# Patient Record
Sex: Male | Born: 1975 | Race: Black or African American | Hispanic: No | Smoking: Current every day smoker
Health system: Southern US, Community
[De-identification: ages and names within clinical notes are randomized; demographics above are authoritative.]

## PROBLEM LIST (undated history)

## (undated) DIAGNOSIS — I10 Essential (primary) hypertension: Secondary | ICD-10-CM

## (undated) HISTORY — PX: HERNIA REPAIR: SHX51

---

## 2001-11-03 ENCOUNTER — Emergency Department (HOSPITAL_COMMUNITY): Admission: EM | Admit: 2001-11-03 | Discharge: 2001-11-03 | Payer: Self-pay | Admitting: Emergency Medicine

## 2002-08-14 ENCOUNTER — Emergency Department (HOSPITAL_COMMUNITY): Admission: EM | Admit: 2002-08-14 | Discharge: 2002-08-14 | Payer: Self-pay | Admitting: *Deleted

## 2002-08-14 ENCOUNTER — Encounter: Payer: Self-pay | Admitting: *Deleted

## 2002-09-12 ENCOUNTER — Encounter: Payer: Self-pay | Admitting: Emergency Medicine

## 2002-09-12 ENCOUNTER — Emergency Department (HOSPITAL_COMMUNITY): Admission: EM | Admit: 2002-09-12 | Discharge: 2002-09-12 | Payer: Self-pay | Admitting: Emergency Medicine

## 2004-09-10 ENCOUNTER — Emergency Department (HOSPITAL_COMMUNITY): Admission: EM | Admit: 2004-09-10 | Discharge: 2004-09-10 | Payer: Self-pay | Admitting: Emergency Medicine

## 2004-10-18 ENCOUNTER — Ambulatory Visit: Payer: Self-pay | Admitting: Orthopedic Surgery

## 2006-10-22 ENCOUNTER — Emergency Department (HOSPITAL_COMMUNITY): Admission: EM | Admit: 2006-10-22 | Discharge: 2006-10-22 | Payer: Self-pay | Admitting: Emergency Medicine

## 2008-06-06 ENCOUNTER — Emergency Department (HOSPITAL_COMMUNITY): Admission: EM | Admit: 2008-06-06 | Discharge: 2008-06-06 | Payer: Self-pay | Admitting: Emergency Medicine

## 2010-01-07 ENCOUNTER — Emergency Department (HOSPITAL_COMMUNITY): Admission: EM | Admit: 2010-01-07 | Discharge: 2010-01-07 | Payer: Self-pay | Admitting: Emergency Medicine

## 2010-03-03 ENCOUNTER — Emergency Department (HOSPITAL_COMMUNITY): Admission: EM | Admit: 2010-03-03 | Discharge: 2010-03-03 | Payer: Self-pay | Admitting: Emergency Medicine

## 2010-10-28 ENCOUNTER — Emergency Department (HOSPITAL_COMMUNITY): Payer: Self-pay

## 2010-10-28 ENCOUNTER — Emergency Department (HOSPITAL_COMMUNITY)
Admission: EM | Admit: 2010-10-28 | Discharge: 2010-10-28 | Disposition: A | Discharge status: Home / Self Care | Payer: Self-pay | Attending: Emergency Medicine | Admitting: Emergency Medicine

## 2010-10-28 DIAGNOSIS — J069 Acute upper respiratory infection, unspecified: Secondary | ICD-10-CM | POA: Insufficient documentation

## 2010-10-28 DIAGNOSIS — R059 Cough, unspecified: Secondary | ICD-10-CM | POA: Insufficient documentation

## 2010-10-28 DIAGNOSIS — J019 Acute sinusitis, unspecified: Secondary | ICD-10-CM | POA: Insufficient documentation

## 2010-10-28 DIAGNOSIS — R0602 Shortness of breath: Secondary | ICD-10-CM | POA: Insufficient documentation

## 2010-10-28 DIAGNOSIS — R05 Cough: Secondary | ICD-10-CM | POA: Insufficient documentation

## 2011-10-08 ENCOUNTER — Emergency Department (HOSPITAL_COMMUNITY): Payer: Self-pay

## 2011-10-08 ENCOUNTER — Emergency Department (HOSPITAL_COMMUNITY)
Admission: EM | Admit: 2011-10-08 | Discharge: 2011-10-08 | Disposition: A | Discharge status: Home / Self Care | Payer: Self-pay | Attending: Emergency Medicine | Admitting: Emergency Medicine

## 2011-10-08 ENCOUNTER — Encounter (HOSPITAL_COMMUNITY): Payer: Self-pay | Admitting: *Deleted

## 2011-10-08 DIAGNOSIS — Z79899 Other long term (current) drug therapy: Secondary | ICD-10-CM | POA: Insufficient documentation

## 2011-10-08 DIAGNOSIS — M7989 Other specified soft tissue disorders: Secondary | ICD-10-CM | POA: Insufficient documentation

## 2011-10-08 DIAGNOSIS — M25476 Effusion, unspecified foot: Secondary | ICD-10-CM | POA: Insufficient documentation

## 2011-10-08 DIAGNOSIS — M109 Gout, unspecified: Secondary | ICD-10-CM | POA: Insufficient documentation

## 2011-10-08 DIAGNOSIS — I10 Essential (primary) hypertension: Secondary | ICD-10-CM | POA: Insufficient documentation

## 2011-10-08 DIAGNOSIS — R609 Edema, unspecified: Secondary | ICD-10-CM | POA: Insufficient documentation

## 2011-10-08 DIAGNOSIS — M25473 Effusion, unspecified ankle: Secondary | ICD-10-CM | POA: Insufficient documentation

## 2011-10-08 DIAGNOSIS — M79609 Pain in unspecified limb: Secondary | ICD-10-CM | POA: Insufficient documentation

## 2011-10-08 HISTORY — DX: Essential (primary) hypertension: I10

## 2011-10-08 MED ORDER — IBUPROFEN 800 MG PO TABS
800.0000 mg | ORAL_TABLET | Freq: Once | ORAL | Status: AC
  Administered 2011-10-08: 800 mg via ORAL
  Filled 2011-10-08: qty 1

## 2011-10-08 MED ORDER — HYDROCODONE-ACETAMINOPHEN 5-325 MG PO TABS: ORAL_TABLET | ORAL | Status: DC

## 2011-10-08 MED ORDER — HYDROCODONE-ACETAMINOPHEN 5-325 MG PO TABS
2.0000 | ORAL_TABLET | Freq: Once | ORAL | Status: AC
  Administered 2011-10-08: 2 via ORAL
  Filled 2011-10-08: qty 2

## 2011-10-08 MED ORDER — IBUPROFEN 800 MG PO TABS: 800.0000 mg | ORAL_TABLET | Freq: Four times a day (QID) | ORAL | Status: AC | PRN

## 2011-10-08 MED ORDER — IBUPROFEN 800 MG PO TABS: 800.0000 mg | ORAL_TABLET | Freq: Once | ORAL | Status: DC

## 2011-10-08 NOTE — ED Notes (Signed)
Left foot swollen for 2 days, denies any injury, able to ambulate some on it

## 2011-10-08 NOTE — ED Notes (Signed)
Left foot warm and pedal pulse is present

## 2011-10-08 NOTE — ED Provider Notes (Signed)
Medical screening examination/treatment/procedure(s) were performed by non-physician practitioner and as supervising physician I was immediately available for consultation/collaboration.  Camani Sesay R. Lachandra Dettmann, MD 10/08/11 2324 

## 2011-10-08 NOTE — ED Provider Notes (Signed)
History     CSN: [id number]  Arrival date & time 10/08/11  1803   First MD Initiated Contact with Patient 10/08/11 1810      Chief Complaint  Patient presents with  . Foot Pain    (Consider location/radiation/quality/duration/timing/severity/associated sxs/prior treatment) HPI Comments: Patient presents with a two-day history of pain and mild swelling in his left great toe. He denies injury but states he is on his feet during his entire shift at work. His job duties are unchanged for the past 8 months. He wears steel toed shoes. Standing increases pain, and his worst towards the end of his shift. He has found no alleviating or score his pain. Of note, he states he has had elevated uric acid levels on general blood tests done during health fairs at his work. He has never been diagnosed with gout, but does have a positive family history for this illness. He has taken ibuprofen with mild improvement in his symptoms.  Patient is a 36 y.o. male presenting with lower extremity pain.  Foot Pain Associated symptoms include joint swelling. Pertinent negatives include no abdominal pain, arthralgias, chest pain, congestion, fever, headaches, nausea, neck pain, numbness, rash, sore throat or weakness.    Past Medical History  Diagnosis Date  . Hypertension     Past Surgical History  Procedure Date  . Hernia repair     History reviewed. No pertinent family history.  History  Substance Use Topics  . Smoking status: Current Everyday Smoker -- 0.5 packs/day    Types: Cigarettes  . Smokeless tobacco: Not on file  . Alcohol Use: Yes     once aweek      Review of Systems  Constitutional: Negative for fever.  HENT: Negative for congestion, sore throat and neck pain.   Eyes: Negative.   Respiratory: Negative for chest tightness and shortness of breath.   Cardiovascular: Negative for chest pain.  Gastrointestinal: Negative for nausea and abdominal pain.  Genitourinary: Negative.     Musculoskeletal: Positive for joint swelling. Negative for arthralgias.  Skin: Negative.  Negative for rash and wound.  Neurological: Negative for dizziness, weakness, light-headedness, numbness and headaches.  Hematological: Negative.   Psychiatric/Behavioral: Negative.     Allergies  Penicillins  Home Medications   Current Outpatient Rx  Name Route Sig Dispense Refill  . IBUPROFEN 200 MG PO TABS Oral Take 800 mg by mouth as needed. FOR PAIN    . LISINOPRIL-HYDROCHLOROTHIAZIDE 20-25 MG PO TABS Oral Take 1 tablet by mouth daily.    Marland Kitchen LOVASTATIN 20 MG PO TABS Oral Take 20 mg by mouth at bedtime.    Marland Kitchen HYDROCODONE-ACETAMINOPHEN 5-325 MG PO TABS  Take one or 2 tablets by mouth every 4 hours when necessary pain 20 tablet 0  . IBUPROFEN 800 MG PO TABS Oral Take 1 tablet (800 mg total) by mouth once. 21 tablet 0    BP 172/94  Pulse 108  Temp(Src) 99 F (37.2 C) (Oral)  Resp 16  Ht 6\' 2"  (1.88 m)  Wt 289 lb (131.09 kg)  BMI 37.11 kg/m2  SpO2 100%  Physical Exam  Nursing note and vitals reviewed. Constitutional: He is oriented to person, place, and time. He appears well-developed and well-nourished.  HENT:  Head: Normocephalic and atraumatic.  Eyes: Conjunctivae are normal.  Neck: Normal range of motion.  Cardiovascular: Normal rate, regular rhythm, normal heart sounds and intact distal pulses.   Pulmonary/Chest: Effort normal and breath sounds normal. He has no wheezes.  Abdominal: Soft.  Bowel sounds are normal. There is no tenderness.  Musculoskeletal: Normal range of motion.  Neurological: He is alert and oriented to person, place, and time.  Skin: Skin is warm and dry. There is erythema.       Modest edema and erythema appreciated at left MTP joint of the first toe. Equal dorsalis pedis pulses bilateral. No visible deformity or trauma. No skin, nails appear healthy.   Psychiatric: He has a normal mood and affect.    ED Course  Procedures (including critical care  time)  Labs Reviewed - No data to display Dg Toe Great Left  10/08/2011  *RADIOLOGY REPORT*  Clinical Data: Pain and swelling for 2 days.  No injury.  No history of diabetes.  LEFT GREAT TOE  Comparison: None.  Findings: No fracture or dislocation, bony erosion or plain film evidence of osteomyelitis involving the left first toe.  IMPRESSION: Negative as noted above.  Original Report Authenticated By: Fuller Canada, M.D.     1. Gout       MDM  Given patient's history of elevated uric acid levels in the past, present to gouty flare with today's presentation. Ibuprofen, hydrocodone, elevation and heat therapy. Referral to Dr. Felecia Shelling to establish primary medical care, otherwise return or for any worsened symptoms.     Candis Musa, PA 10/08/11 1920

## 2011-10-23 ENCOUNTER — Encounter (HOSPITAL_COMMUNITY): Payer: Self-pay

## 2011-10-23 ENCOUNTER — Emergency Department (HOSPITAL_COMMUNITY)
Admission: EM | Admit: 2011-10-23 | Discharge: 2011-10-23 | Disposition: A | Discharge status: Home / Self Care | Payer: Self-pay | Attending: Emergency Medicine | Admitting: Emergency Medicine

## 2011-10-23 DIAGNOSIS — F17200 Nicotine dependence, unspecified, uncomplicated: Secondary | ICD-10-CM | POA: Insufficient documentation

## 2011-10-23 DIAGNOSIS — M109 Gout, unspecified: Secondary | ICD-10-CM | POA: Insufficient documentation

## 2011-10-23 DIAGNOSIS — I10 Essential (primary) hypertension: Secondary | ICD-10-CM | POA: Insufficient documentation

## 2011-10-23 MED ORDER — INDOMETHACIN 25 MG PO CAPS
50.0000 mg | ORAL_CAPSULE | Freq: Once | ORAL | Status: AC
  Administered 2011-10-23: 50 mg via ORAL
  Filled 2011-10-23: qty 2

## 2011-10-23 MED ORDER — INDOMETHACIN 50 MG PO CAPS: 50.0000 mg | ORAL_CAPSULE | Freq: Three times a day (TID) | ORAL | Status: AC

## 2011-10-23 NOTE — ED Notes (Signed)
Pt reports 2 weeks ago was diagnosed with gout in left foot.  Says was given ibuprofen and some pain medication but is out.  Says great toe is still swollen.

## 2011-10-23 NOTE — ED Provider Notes (Signed)
Medical screening examination/treatment/procedure(s) were performed by non-physician practitioner and as supervising physician I was immediately available for consultation/collaboration. Devoria Albe, MD, Armando Gang   Ward Givens, MD 10/23/11 [phone]

## 2011-10-23 NOTE — ED Provider Notes (Signed)
History     CSN: [id number]  Arrival date & time 10/23/11  1030   First MD Initiated Contact with Patient 10/23/11 1237      Chief Complaint  Patient presents with  . Foot Pain    (Consider location/radiation/quality/duration/timing/severity/associated sxs/prior treatment) Patient is a 36 y.o. male presenting with lower extremity pain. The history is provided by the patient. No language interpreter was used.  Foot Pain This is a new problem. The problem occurs constantly. The problem has been gradually improving. The symptoms are aggravated by walking and standing. He has tried nothing for the symptoms.    Past Medical History  Diagnosis Date  . Hypertension   . Gout     Past Surgical History  Procedure Date  . Hernia repair     No family history on file.  History  Substance Use Topics  . Smoking status: Current Everyday Smoker -- 0.5 packs/day    Types: Cigarettes  . Smokeless tobacco: Not on file  . Alcohol Use: Yes     once aweek      Review of Systems  Musculoskeletal:       Toe pain  All other systems reviewed and are negative.    Allergies  Penicillins  Home Medications   Current Outpatient Rx  Name Route Sig Dispense Refill  . LISINOPRIL-HYDROCHLOROTHIAZIDE 20-25 MG PO TABS Oral Take 1 tablet by mouth daily.    Marland Kitchen LOVASTATIN 20 MG PO TABS Oral Take 20 mg by mouth at bedtime.      BP 152/79  Pulse 80  Temp(Src) 98.7 F (37.1 C) (Oral)  Resp 20  Ht 6\' 2"  (1.88 m)  Wt 280 lb (127.007 kg)  BMI 35.95 kg/m2  SpO2 97%  Physical Exam  Nursing note and vitals reviewed. Constitutional: He is oriented to person, place, and time. He appears well-developed and well-nourished. No distress.  HENT:  Head: Normocephalic and atraumatic.  Eyes: EOM are normal.  Neck: Normal range of motion.  Cardiovascular: Normal rate, regular rhythm, normal heart sounds and intact distal pulses.   Pulmonary/Chest: Effort normal and breath sounds normal. No  respiratory distress.  Abdominal: Soft. He exhibits no distension. There is no tenderness.  Musculoskeletal: He exhibits tenderness.       Left foot: He exhibits decreased range of motion, tenderness, bony tenderness and swelling. He exhibits normal capillary refill, no crepitus, no deformity and no laceration.       Feet:  Neurological: He is alert and oriented to person, place, and time.  Skin: Skin is warm and dry.  Psychiatric: He has a normal mood and affect. Judgment normal.    ED Course  Procedures (including critical care time)  Labs Reviewed - No data to display No results found.   No diagnosis found.    MDM          Worthy Rancher, PA 10/23/11 1251

## 2012-10-18 ENCOUNTER — Emergency Department (HOSPITAL_COMMUNITY)
Admission: EM | Admit: 2012-10-18 | Discharge: 2012-10-18 | Disposition: A | Discharge status: Home / Self Care | Payer: 59 | Attending: Emergency Medicine | Admitting: Emergency Medicine

## 2012-10-18 ENCOUNTER — Encounter (HOSPITAL_COMMUNITY): Payer: Self-pay | Admitting: Emergency Medicine

## 2012-10-18 DIAGNOSIS — R109 Unspecified abdominal pain: Secondary | ICD-10-CM | POA: Insufficient documentation

## 2012-10-18 DIAGNOSIS — R5381 Other malaise: Secondary | ICD-10-CM | POA: Insufficient documentation

## 2012-10-18 DIAGNOSIS — Z8639 Personal history of other endocrine, nutritional and metabolic disease: Secondary | ICD-10-CM | POA: Insufficient documentation

## 2012-10-18 DIAGNOSIS — I10 Essential (primary) hypertension: Secondary | ICD-10-CM | POA: Insufficient documentation

## 2012-10-18 DIAGNOSIS — E860 Dehydration: Secondary | ICD-10-CM | POA: Insufficient documentation

## 2012-10-18 DIAGNOSIS — R509 Fever, unspecified: Secondary | ICD-10-CM | POA: Insufficient documentation

## 2012-10-18 DIAGNOSIS — Z862 Personal history of diseases of the blood and blood-forming organs and certain disorders involving the immune mechanism: Secondary | ICD-10-CM | POA: Insufficient documentation

## 2012-10-18 DIAGNOSIS — F17200 Nicotine dependence, unspecified, uncomplicated: Secondary | ICD-10-CM | POA: Insufficient documentation

## 2012-10-18 DIAGNOSIS — Z79899 Other long term (current) drug therapy: Secondary | ICD-10-CM | POA: Insufficient documentation

## 2012-10-18 DIAGNOSIS — R112 Nausea with vomiting, unspecified: Secondary | ICD-10-CM | POA: Insufficient documentation

## 2012-10-18 DIAGNOSIS — R197 Diarrhea, unspecified: Secondary | ICD-10-CM | POA: Insufficient documentation

## 2012-10-18 DIAGNOSIS — R5383 Other fatigue: Secondary | ICD-10-CM | POA: Insufficient documentation

## 2012-10-18 LAB — URINALYSIS, ROUTINE W REFLEX MICROSCOPIC
Leukocytes, UA: NEGATIVE
Nitrite: NEGATIVE
Specific Gravity, Urine: >1.03 — ABNORMAL HIGH (ref 1.005–1.030)
Urobilinogen, UA: 0.2 mg/dL (ref 0.0–1.0)

## 2012-10-18 LAB — CBC WITH DIFFERENTIAL/PLATELET
Basophils Absolute: 0.1 10*3/uL (ref 0.0–0.1)
Eosinophils Absolute: 0.1 10*3/uL (ref 0.0–0.7)
Eosinophils Relative: 2 % (ref 0–5)
HCT: 47.6 % (ref 39.0–52.0)
Lymphocytes Relative: 45 % (ref 12–46)
Lymphs Abs: 3 10*3/uL (ref 0.7–4.0)
MCH: 31.7 pg (ref 26.0–34.0)
MCV: 91 fL (ref 78.0–100.0)
Monocytes Absolute: 0.7 10*3/uL (ref 0.1–1.0)
RDW: 13 % (ref 11.5–15.5)
WBC: 6.7 10*3/uL (ref 4.0–10.5)

## 2012-10-18 LAB — COMPREHENSIVE METABOLIC PANEL
CO2: 24 mEq/L (ref 19–32)
Calcium: 9.3 mg/dL (ref 8.4–10.5)
Creatinine, Ser: 0.92 mg/dL (ref 0.50–1.35)
GFR calc Af Amer: >90 mL/min (ref >90)
GFR calc non Af Amer: >90 mL/min (ref >90)
Glucose, Bld: 94 mg/dL (ref 70–99)
Total Protein: 7.9 g/dL (ref 6.0–8.3)

## 2012-10-18 MED ORDER — DIPHENOXYLATE-ATROPINE 2.5-0.025 MG PO TABS
2.0000 | ORAL_TABLET | Freq: Once | ORAL | Status: AC
  Administered 2012-10-18: 2 via ORAL
  Filled 2012-10-18: qty 2

## 2012-10-18 MED ORDER — SODIUM CHLORIDE 0.9 % IV BOLUS (SEPSIS)
1000.0000 mL | Freq: Once | INTRAVENOUS | Status: AC
  Administered 2012-10-18: 1000 mL via INTRAVENOUS

## 2012-10-18 NOTE — ED Notes (Signed)
RN at bedside

## 2012-10-18 NOTE — ED Notes (Signed)
Patient reports that on Thursday he started having nausea, vomiting, fever and abdominal pain followed by diarrhea the next day.  States that the nausea, vomiting, fever and abdominal pain have resolved, however, diarrhea continues.  States he is tolerating food and fluids fine.

## 2012-10-18 NOTE — ED Provider Notes (Signed)
History  This chart was scribed for Randy B. Bernette Mayers, MD by Marlin Canary ED Scribe. The patient was seen in room APA14/APA14. Patient's care was started at 1321.  CSN: [id number]  Arrival date & time 10/18/12  1119   First MD Initiated Contact with Patient 10/18/12 1321      Chief Complaint  Patient presents with  . Fatigue  . Diarrhea   The history is provided by the patient. No language interpreter was used.    HPI Comments: Randy Beltran is a 37 y.o. male who presents to the Emergency Department complaining of persistent, watery diarrhea onset 3-4 days ago. There was associated nausea, vomiting, chills, initially which have resolved, but still having abdominal cramping and fatigue. He says that abdominal cramping improves after a BM. He reports bowel movements every 2 hours. Patient says that he is tolerant of fluids and food at this time. He states that he took anti-diarrhea medicine yesterday morning, but it provided no relief. Patient denies fever, blood in stool, dysuria or frequency. He states that his daughter has been sick. He is allergic to penicillin. He denies any history of diabetes. Patient is a current every day smoker.  Past Medical History  Diagnosis Date  . Hypertension   . Gout     Past Surgical History  Procedure Date  . Hernia repair     Family History  Problem Relation Age of Onset  . Diabetes Other     History  Substance Use Topics  . Smoking status: Current Every Day Smoker -- 0.5 packs/day for 5 years    Types: Cigarettes  . Smokeless tobacco: Never Used  . Alcohol Use: Yes     Comment: once aweek      Review of Systems A complete 10 system review of systems was obtained and all systems are negative except as noted in the HPI and PMH.   Allergies  Penicillins  Home Medications   Current Outpatient Rx  Name  Route  Sig  Dispense  Refill  . LISINOPRIL-HYDROCHLOROTHIAZIDE 20-25 MG PO TABS   Oral   Take 1 tablet by mouth  daily.         Marland Kitchen LOVASTATIN 20 MG PO TABS   Oral   Take 20 mg by mouth at bedtime.           BP 148/80  Pulse 84  Temp 98 F (36.7 C) (Oral)  Resp 20  Ht 6\' 2"  (1.88 m)  Wt 260 lb (117.935 kg)  BMI 33.38 kg/m2  SpO2 97%  Physical Exam  Nursing note and vitals reviewed. Constitutional: He is oriented to person, place, and time. He appears well-developed and well-nourished.  HENT:  Head: Normocephalic and atraumatic.       Dry mouth.  Eyes: EOM are normal. Pupils are equal, round, and reactive to light.  Neck: Normal range of motion. Neck supple.  Cardiovascular: Normal rate, normal heart sounds and intact distal pulses.   Pulmonary/Chest: Effort normal and breath sounds normal.  Abdominal: Bowel sounds are normal. He exhibits no distension. There is no tenderness.  Musculoskeletal: Normal range of motion. He exhibits no edema and no tenderness.  Neurological: He is alert and oriented to person, place, and time. He has normal strength. No cranial nerve deficit or sensory deficit.  Skin: Skin is warm and dry. No rash noted.  Psychiatric: He has a normal mood and affect.    ED Course  Procedures (including critical care time) DIAGNOSTIC STUDIES: Oxygen Saturation is  97% on room air, Adequate by my interpretation.    COORDINATION OF CARE: 1322- Will give IV fluids and lomotil. Will order CBC, comprehensive metabolic panel and urinalysis. Patient informed of current plan for treatment and evaluation and agrees with plan at this time.   Labs Reviewed - No data to display No results found.   No diagnosis found.    MDM  UA is concentrated, but otherwise labs unremarkable. Pt advised to continue with oral hydration. Return for worsening pain, fever or blood in stool.       I personally performed the services described in this documentation, which was scribed in my presence. The recorded information has been reviewed and is accurate.     Randy B. Bernette Mayers,  MD 10/18/12 1524

## 2012-10-18 NOTE — ED Notes (Signed)
Patient c/o diarrhea since Thursday afternoon. Per patient woke that morning with chills, nausea, and, vomiting. Vomiting stopped that night and he started to have the diarrhea. Per patient now feeling weak.

## 2012-10-18 NOTE — ED Notes (Signed)
Patient would like something to drink at this time. 

## 2012-10-18 NOTE — ED Notes (Signed)
Patient ambulatory to restroom  ?

## 2012-11-13 ENCOUNTER — Other Ambulatory Visit (HOSPITAL_COMMUNITY): Payer: Self-pay | Admitting: Orthopaedic Surgery

## 2012-11-13 DIAGNOSIS — R52 Pain, unspecified: Secondary | ICD-10-CM

## 2012-11-17 ENCOUNTER — Ambulatory Visit (HOSPITAL_COMMUNITY): Payer: 59

## 2012-11-17 ENCOUNTER — Ambulatory Visit (HOSPITAL_COMMUNITY)
Admission: RE | Admit: 2012-11-17 | Discharge: 2012-11-17 | Disposition: A | Discharge status: Home / Self Care | Payer: Managed Care, Other (non HMO) | Source: Ambulatory Visit | Attending: Orthopaedic Surgery | Admitting: Orthopaedic Surgery

## 2012-11-17 ENCOUNTER — Other Ambulatory Visit (HOSPITAL_COMMUNITY): Payer: Self-pay | Admitting: Orthopaedic Surgery

## 2012-11-17 DIAGNOSIS — M25569 Pain in unspecified knee: Secondary | ICD-10-CM | POA: Insufficient documentation

## 2012-11-17 DIAGNOSIS — M7120 Synovial cyst of popliteal space [Baker], unspecified knee: Secondary | ICD-10-CM | POA: Insufficient documentation

## 2012-11-17 DIAGNOSIS — M25469 Effusion, unspecified knee: Secondary | ICD-10-CM | POA: Insufficient documentation

## 2012-11-17 DIAGNOSIS — R52 Pain, unspecified: Secondary | ICD-10-CM

## 2012-11-17 DIAGNOSIS — M2240 Chondromalacia patellae, unspecified knee: Secondary | ICD-10-CM | POA: Insufficient documentation

## 2012-11-17 DIAGNOSIS — R937 Abnormal findings on diagnostic imaging of other parts of musculoskeletal system: Secondary | ICD-10-CM | POA: Insufficient documentation

## 2015-09-27 ENCOUNTER — Emergency Department (HOSPITAL_COMMUNITY)
Admission: EM | Admit: 2015-09-27 | Discharge: 2015-09-27 | Disposition: A | Discharge status: Home / Self Care | Payer: Managed Care, Other (non HMO) | Attending: Emergency Medicine | Admitting: Emergency Medicine

## 2015-09-27 ENCOUNTER — Encounter (HOSPITAL_COMMUNITY): Payer: Self-pay | Admitting: *Deleted

## 2015-09-27 ENCOUNTER — Emergency Department (HOSPITAL_COMMUNITY): Payer: Managed Care, Other (non HMO)

## 2015-09-27 DIAGNOSIS — Z760 Encounter for issue of repeat prescription: Secondary | ICD-10-CM | POA: Insufficient documentation

## 2015-09-27 DIAGNOSIS — Z880 Allergy status to penicillin: Secondary | ICD-10-CM | POA: Insufficient documentation

## 2015-09-27 DIAGNOSIS — F17210 Nicotine dependence, cigarettes, uncomplicated: Secondary | ICD-10-CM | POA: Insufficient documentation

## 2015-09-27 DIAGNOSIS — I10 Essential (primary) hypertension: Secondary | ICD-10-CM | POA: Insufficient documentation

## 2015-09-27 DIAGNOSIS — M10072 Idiopathic gout, left ankle and foot: Secondary | ICD-10-CM | POA: Insufficient documentation

## 2015-09-27 DIAGNOSIS — Z79899 Other long term (current) drug therapy: Secondary | ICD-10-CM | POA: Insufficient documentation

## 2015-09-27 LAB — CBG MONITORING, ED: GLUCOSE-CAPILLARY: 96 mg/dL (ref 65–99)

## 2015-09-27 MED ORDER — PREDNISONE 10 MG PO TABS: ORAL_TABLET | ORAL | Status: DC

## 2015-09-27 MED ORDER — OXYCODONE-ACETAMINOPHEN 5-325 MG PO TABS: 2.0000 | ORAL_TABLET | Freq: Once | ORAL | Status: DC

## 2015-09-27 MED ORDER — PREDNISONE 50 MG PO TABS
60.0000 mg | ORAL_TABLET | Freq: Once | ORAL | Status: AC
  Administered 2015-09-27: 60 mg via ORAL
  Filled 2015-09-27: qty 1

## 2015-09-27 MED ORDER — OXYCODONE-ACETAMINOPHEN 5-325 MG PO TABS
2.0000 | ORAL_TABLET | Freq: Once | ORAL | Status: DC
  Filled 2015-09-27: qty 2

## 2015-09-27 MED ORDER — LISINOPRIL-HYDROCHLOROTHIAZIDE 20-25 MG PO TABS: 1.0000 | ORAL_TABLET | Freq: Every day | ORAL | Status: DC

## 2015-09-27 NOTE — ED Notes (Signed)
Pt comes in with left foot pain and swelling starting at 0300 today. Pt denies any injury; states he has history of gout. NAD noted.

## 2015-09-27 NOTE — Discharge Instructions (Signed)

## 2015-09-29 NOTE — ED Provider Notes (Signed)
CSN: [id number]     Arrival date & time 09/27/15  1154 History   First MD Initiated Contact with Patient 09/27/15 1223     Chief Complaint  Patient presents with  . Foot Pain     (Consider location/radiation/quality/duration/timing/severity/associated sxs/prior Treatment) The history is provided by the patient.   Randy Beltran is a 40 y.o. male presenting with left dorsal foot pain and swelling c/w prior episodeof gout.  This episode started early this am, waking him from sleep.  His pain is constant and worsened with even light palpation or movement.  He has taken tylenol for this prior to arrival without relief. He denies fevers, chill, injury.     Past Medical History  Diagnosis Date  . Hypertension   . Gout    Past Surgical History  Procedure Laterality Date  . Hernia repair     Family History  Problem Relation Age of Onset  . Diabetes Other    Social History  Substance Use Topics  . Smoking status: Current Every Day Smoker -- 0.50 packs/day for 5 years    Types: Cigarettes  . Smokeless tobacco: Never Used  . Alcohol Use: Yes     Comment: once aweek    Review of Systems  Constitutional: Negative for fever.  Musculoskeletal: Positive for joint swelling and arthralgias. Negative for myalgias.  Neurological: Negative for weakness and numbness.      Allergies  Penicillins  Home Medications   Prior to Admission medications   Medication Sig Start Date End Date Taking? Authorizing Provider  lisinopril-hydrochlorothiazide (PRINZIDE,ZESTORETIC) 20-25 MG per tablet Take 1 tablet by mouth daily.    Historical Provider, MD  lisinopril-hydrochlorothiazide (PRINZIDE,ZESTORETIC) 20-25 MG tablet Take 1 tablet by mouth daily. 09/27/15   Burgess AmorJulie Uvaldo Rybacki, PA-C  lovastatin (MEVACOR) 20 MG tablet Take 20 mg by mouth at bedtime.    Historical Provider, MD  predniSONE (DELTASONE) 10 MG tablet 6, 5, 4, 3, 2 then 1 tablet by mouth daily for 6 days total. 09/27/15   Burgess AmorJulie Azka Steger, PA-C   BP  163/102 mmHg  Pulse 109  Temp(Src) 98.6 F (37 C) (Oral)  Resp 18  Ht 6\' 2"  (1.88 m)  Wt 122.471 kg  BMI 34.65 kg/m2  SpO2 99% Physical Exam  Constitutional: He appears well-developed and well-nourished.  HENT:  Head: Atraumatic.  Neck: Normal range of motion.  Cardiovascular:  Pulses equal bilaterally  Musculoskeletal: He exhibits edema and tenderness.       Feet:  Tender to even light palpation left medial midfoot. Distal sensation intact with less than 2 sec cap refill in toes.  Dorsalis pedal pulse full. Mild increased warmth at site of edema. Skin intact.  Neurological: He is alert. He has normal strength. He displays normal reflexes. No sensory deficit.  Skin: Skin is warm and dry.  Psychiatric: He has a normal mood and affect.    ED Course  Procedures (including critical care time) Labs Review Labs Reviewed  CBG MONITORING, ED    Imaging Review No results found. I have personally reviewed and evaluated these images and lab results as part of my medical decision-making.   EKG Interpretation None      MDM   Final diagnoses:  Acute idiopathic gout of left foot    Pt prescribed prednisone taper.  Pt defers other pain medicine.  Advised elevation, arm compresses.  Discussed elevated bp.  He ran out of his prinzide 3 days ago, awaiting ref[facility], hopes to get this  week.  Pt prescribed #10 to get started back today. Denies cp/headache/ visual changes, sob. No sign of infection as source of sx.  Plan f/u with pcp at the [facility] prn. Return here for any worsened sx.  The patient appears reasonably screened and/or stabilized for discharge and I doubt any other medical condition or other [facility] requiring further screening, evaluation, or treatment in the ED at this time prior to discharge.     Burgess Amor, PA-C 09/29/15 4098  Randy Lyons, MD 09/30/15 [phone]

## 2017-11-29 ENCOUNTER — Encounter (HOSPITAL_COMMUNITY): Payer: Self-pay | Admitting: Emergency Medicine

## 2017-11-29 ENCOUNTER — Emergency Department (HOSPITAL_COMMUNITY)
Admission: EM | Admit: 2017-11-29 | Discharge: 2017-11-29 | Disposition: A | Discharge status: Home / Self Care | Payer: Managed Care, Other (non HMO) | Attending: Emergency Medicine | Admitting: Emergency Medicine

## 2017-11-29 DIAGNOSIS — F17210 Nicotine dependence, cigarettes, uncomplicated: Secondary | ICD-10-CM | POA: Insufficient documentation

## 2017-11-29 DIAGNOSIS — I10 Essential (primary) hypertension: Secondary | ICD-10-CM | POA: Insufficient documentation

## 2017-11-29 DIAGNOSIS — M1A2720 Drug-induced chronic gout, left ankle and foot, without tophus (tophi): Secondary | ICD-10-CM | POA: Insufficient documentation

## 2017-11-29 DIAGNOSIS — M10272 Drug-induced gout, left ankle and foot: Secondary | ICD-10-CM

## 2017-11-29 MED ORDER — PREDNISONE 20 MG PO TABS: ORAL_TABLET | ORAL | 0 refills | Status: DC

## 2017-11-29 MED ORDER — COLCHICINE 0.6 MG PO TABS: 0.6000 mg | ORAL_TABLET | Freq: Two times a day (BID) | ORAL | 0 refills | Status: DC

## 2017-11-29 MED ORDER — LISINOPRIL 20 MG PO TABS: 20.0000 mg | ORAL_TABLET | Freq: Every day | ORAL | 0 refills | Status: DC

## 2017-11-29 MED ORDER — COLCHICINE 0.6 MG PO TABS
0.6000 mg | ORAL_TABLET | Freq: Once | ORAL | Status: AC
  Administered 2017-11-29: 0.6 mg via ORAL
  Filled 2017-11-29: qty 1

## 2017-11-29 MED ORDER — KETOROLAC TROMETHAMINE 60 MG/2ML IM SOLN
60.0000 mg | Freq: Once | INTRAMUSCULAR | Status: AC
  Administered 2017-11-29: 60 mg via INTRAMUSCULAR
  Filled 2017-11-29: qty 2

## 2017-11-29 NOTE — ED Provider Notes (Signed)
[facility] Provider Note   CSN: [id number] Arrival date & time: 11/29/17  0035  Time seen 01:20 AM   History   Chief Complaint Chief Complaint  Patient presents with  . Foot Pain    HPI Randy Beltran is a 42 y.o. male.  HPI patient reports he has had a history of gout and it normally affects his big toe.  He states about a month ago he started having swelling in his whole left foot and now it is in his left great toe area and starting to affect his left ankle.  He states he had fever up to 101 three weeks ago.  He states he has pain off and on in the last time the pain started was March 4.  He denies any knee pain.  He states sometimes he has difficulty walking and other times he does not.  He states he noted some swelling in his ankle tonight which made him get concerned and come to the ED.  He has been taking ibuprofen 800 mg every 8 hours but not on a regular basis.  He states he has not any alcohol in 8 months.  He denies any trauma to his foot or any new shoes.  [place] [facility] in [place]  Past Medical History:  Diagnosis Date  . Gout   . Hypertension     There are no active problems to display for this patient.   Past Surgical History:  Procedure Laterality Date  . HERNIA REPAIR         Home Medications    Prior to Admission medications   Medication Sig Start Date End Date Taking? Authorizing Provider  colchicine 0.6 MG tablet Take 1 tablet (0.6 mg total) by mouth 2 (two) times daily. 11/29/17   Devoria AlbeKnapp, Sidni Fusco, MD  lisinopril (PRINIVIL,ZESTRIL) 20 MG tablet Take 1 tablet (20 mg total) by mouth daily. 11/29/17   Devoria AlbeKnapp, Quisha Mabie, MD  lovastatin (MEVACOR) 20 MG tablet Take 20 mg by mouth at bedtime.    [provider]  predniSONE (DELTASONE) 20 MG tablet Take 3 po QD x 3d , then 2 po QD x 3d then 1 po QD x 3d 11/29/17   Devoria AlbeKnapp, Emmarie Sannes, MD    Family History Family History  Problem Relation Age of Onset  . Diabetes Other     Social History Social  History   Tobacco Use  . Smoking status: Current Every Day Smoker    Packs/day: 0.50    Years: 5.00    Pack years: 2.50    Types: Cigarettes  . Smokeless tobacco: Never Used  Substance Use Topics  . Alcohol use: Yes    Comment: once aweek  . Drug use: No  employed No alcohol in 8 months   Allergies   Penicillins   Review of Systems Review of Systems  All other systems reviewed and are negative.    Physical Exam Updated Vital Signs BP (!) 164/96 (BP Location: Right Arm)   Pulse 96   Temp 98.1 F (36.7 C) (Oral)   Resp 18   Ht 6\' 2"  (1.88 m)   Wt 129.3 kg (285 lb)   SpO2 99%   BMI 36.59 kg/m   Vital signs normal except for hypertension   Physical Exam  Constitutional: He is oriented to person, place, and time. He appears well-developed and well-nourished.  Non-toxic appearance. He does not appear ill. No distress.  HENT:  Head: Normocephalic and atraumatic.  Right Ear: External ear normal.  Left Ear: External ear normal.  Eyes: Conjunctivae and EOM are normal.  Neck: Normal range of motion and full passive range of motion without pain.  Cardiovascular: Normal rate.  Pulmonary/Chest: Effort normal. No respiratory distress. He has no rhonchi. He exhibits no crepitus.  Abdominal: Normal appearance.  Musculoskeletal: Normal range of motion. He exhibits edema and tenderness.  Moves all extremities well.  Patient's left knee is nontender and without effusion, his left lower leg is nontender.  The malleoli of his ankles are nontender although he does have some mild swelling.  He is tender on the anterior ankle between the malleoli and he states that is where he hurts when he tries to dorsiflex his foot.  He is noted to have some diffuse swelling of his left great toe and the MCP joint.  He states his pain is mainly in the IP joint of the left great toe and it hurts more when he tries to flex the toe.  There is some increased warmth of the left great toe with some mild  redness of the skin in the same area.  Neurological: He is alert and oriented to person, place, and time. He has normal strength. No cranial nerve deficit.  Skin: Skin is warm, dry and intact. No rash noted. No erythema. No pallor.  Psychiatric: He has a normal mood and affect. His speech is normal and behavior is normal. His mood appears not anxious.  Nursing note and vitals reviewed.    ED Treatments / Results  Labs (all labs ordered are listed, but only abnormal results are displayed) Labs Reviewed - No data to display  EKG  EKG Interpretation None       Radiology No results found.  Procedures Procedures (including critical care time)  Medications Ordered in ED Medications  ketorolac (TORADOL) injection 60 mg (60 mg Intramuscular Given 11/29/17 0211)  colchicine tablet 0.6 mg (0.6 mg Oral Given 11/29/17 0211)     Initial Impression / Assessment and Plan / ED Course  I have reviewed the triage vital signs and the nursing notes.  Pertinent labs & imaging results that were available during my care of the patient were reviewed by me and considered in my medical decision making (see chart for details).      Patient's exam is most consistent with gout.  Do not suspect cellulitis.  We discussed that the HCTZ in his combination blood pressure pill can precipitate gout attacks more often.  He was given a prescription for just the lisinopril and advised to follow-up with his doctor at the [facility] to see if they need to adjust his blood pressure medications.  Patient is already avoiding alcohol.  He was given the low purine diet information.  He was given Toradol and started on colchicine.  Final Clinical Impressions(s) / ED Diagnoses   Final diagnoses:  Acute drug-induced gout involving toe of left foot    ED Discharge Orders        Ordered    colchicine 0.6 MG tablet  2 times daily     11/29/17 0204    predniSONE (DELTASONE) 20 MG tablet     11/29/17 0204     lisinopril (PRINIVIL,ZESTRIL) 20 MG tablet  Daily     11/29/17 0204     Plan discharge  Devoria Albe, MD, Concha Pyo, MD 11/29/17 [phone]

## 2017-11-29 NOTE — Discharge Instructions (Addendum)
Use heat for comfort. Take the medications as prescribed. Stop your lisinopril/HCTZ combination blood pressure pill, the HCTZ can make you get gout attacks more often. Start the lisinopril pill and talk to your doctor in the next month to see if they need to change your blood pressure medications. Recheck if your symptoms are not improving in the next week.  Look at the low purine diet so you can avoid things that can make gout worse.

## 2017-11-29 NOTE — ED Triage Notes (Signed)
Pt reports pain  And swelling to his left foot and great toe, denies injury, states has had gout in the foot previously.

## 2020-07-11 ENCOUNTER — Other Ambulatory Visit: Payer: Managed Care, Other (non HMO)

## 2020-10-23 ENCOUNTER — Other Ambulatory Visit: Payer: Self-pay

## 2020-10-23 ENCOUNTER — Encounter: Payer: Self-pay | Admitting: Emergency Medicine

## 2020-10-23 ENCOUNTER — Ambulatory Visit (INDEPENDENT_AMBULATORY_CARE_PROVIDER_SITE_OTHER): Payer: PRIVATE HEALTH INSURANCE

## 2020-10-23 ENCOUNTER — Ambulatory Visit
Admission: EM | Admit: 2020-10-23 | Discharge: 2020-10-23 | Disposition: A | Discharge status: Home / Self Care | Payer: PRIVATE HEALTH INSURANCE | Attending: Family Medicine | Admitting: Family Medicine

## 2020-10-23 DIAGNOSIS — M25572 Pain in left ankle and joints of left foot: Secondary | ICD-10-CM

## 2020-10-23 DIAGNOSIS — M25562 Pain in left knee: Secondary | ICD-10-CM

## 2020-10-23 DIAGNOSIS — M25462 Effusion, left knee: Secondary | ICD-10-CM | POA: Diagnosis not present

## 2020-10-23 NOTE — ED Triage Notes (Signed)
Pt states he was watching TV and his left knee started swelling, no known injury, only hurts when walking ankle on left side hurts when walking as well.

## 2020-10-23 NOTE — Discharge Instructions (Signed)
You have a joint effusion  You would get some relief if the area was drained  Follow up with orthopedics for this.  If pain and swelling increase, you notice red streaking up the leg, you have a high fever, follow up in the ER for further evaluation and treatment

## 2020-10-23 NOTE — ED Provider Notes (Signed)
[facility]   [id number] 10/23/20 Arrival Time: 1245  [id number] PAIN  SUBJECTIVE: History from: patient. Randy Beltran is a 45 y.o. male complains of L knee pain and L ankle pain and swelling. that began last night. Reports that he has had to have this knee drained in the past. Does have hx gout. Denies a precipitating event or specific injury. Describes the pain as intermittent and achy in character. Has tried OTC medications without relief.  Symptoms are made worse with activity.  Reports similar symptoms in the past.  Denies fever, chills, erythema, ecchymosis, weakness, numbness and tingling, saddle paresthesias, loss of bowel or bladder function.      ROS: As per HPI.  All other pertinent ROS negative.     Past Medical History:  Diagnosis Date  . Gout   . Hypertension    Past Surgical History:  Procedure Laterality Date  . HERNIA REPAIR     Allergies  Allergen Reactions  . Penicillins Swelling    Severe swelling of eyes and lips   No current facility-administered medications on file prior to encounter.   Current Outpatient Medications on File Prior to Encounter  Medication Sig Dispense Refill  . colchicine 0.6 MG tablet Take 1 tablet (0.6 mg total) by mouth 2 (two) times daily. 20 tablet 0  . lisinopril (PRINIVIL,ZESTRIL) 20 MG tablet Take 1 tablet (20 mg total) by mouth daily. 30 tablet 0  . lovastatin (MEVACOR) 20 MG tablet Take 20 mg by mouth at bedtime.    . predniSONE (DELTASONE) 20 MG tablet Take 3 po QD x 3d , then 2 po QD x 3d then 1 po QD x 3d 18 tablet 0   Social History   Socioeconomic History  . Marital status: Legally Separated    Spouse name: Not on file  . Number of children: Not on file  . Years of education: Not on file  . Highest education level: Not on file  Occupational History  . Not on file  Tobacco Use  . Smoking status: Current Every Day Smoker    Packs/day: 0.50    Years: 5.00    Pack years: 2.50    Types: Cigarettes  .  Smokeless tobacco: Never Used  Substance and Sexual Activity  . Alcohol use: Yes    Comment: once aweek  . Drug use: No  . Sexual activity: Not on file  Other Topics Concern  . Not on file  Social History Narrative  . Not on file   Social Determinants of Health   Financial Resource Strain: Not on file  Food Insecurity: Not on file  Transportation Needs: Not on file  Physical Activity: Not on file  Stress: Not on file  Social Connections: Not on file  Intimate Partner Violence: Not on file   Family History  Problem Relation Age of Onset  . Diabetes Other     OBJECTIVE:  Vitals:   10/23/20 1316  BP: (!) 164/80  Pulse: (!) 101  Resp: 18  Temp: 98.2 F (36.8 C)  TempSrc: Oral  SpO2: 96%    General appearance: ALERT; in no acute distress.  Head: NCAT Lungs: Normal respiratory effort CV: pulses 2+ bilaterally. Cap refill < 2 seconds Musculoskeletal:  Inspection: Skin warm, dry, clear and intact Effusion to L knee and L ankle Palpation: L knee tender to palpation ROM: Limited ROM active and passive to L knee and L ankleSkin: warm and dry Neurologic: Ambulates without difficulty; Sensation intact about the upper/ lower  extremities Psychological: alert and cooperative; normal mood and affect  DIAGNOSTIC STUDIES:  DG Ankle Complete Left  Result Date: 10/23/2020 CLINICAL DATA:  Left ankle pain without known injury. EXAM: LEFT ANKLE COMPLETE - 3+ VIEW COMPARISON:  None. FINDINGS: There is no evidence of fracture, dislocation, or joint effusion. There is no evidence of arthropathy or other focal bone abnormality. Soft tissues are unremarkable. IMPRESSION: Negative. Electronically Signed   By: Lupita Raider M.D.   On: 10/23/2020 14:01   DG Knee Complete 4 Views Left  Result Date: 10/23/2020 CLINICAL DATA:  Left knee pain without known injury. EXAM: LEFT KNEE - COMPLETE 4+ VIEW COMPARISON:  None. FINDINGS: No fracture or dislocation is noted. Large suprapatellar joint  effusion is noted. Joint spaces are intact. Loose bodies are noted posteriorly. IMPRESSION: Large suprapatellar joint effusion. Loose bodies are noted posteriorly. No fracture or dislocation is noted. Electronically Signed   By: Lupita Raider M.D.   On: 10/23/2020 14:02     ASSESSMENT & PLAN:  1. Effusion of left knee   2. Acute pain of left knee   3. Acute left ankle pain    X-ray shows joint effusion around the left knee X-ray negative of left ankle States that he has an established orthopedic who has had to drain fluid from the left knee in the past Instructed to follow-up with orthopedics to have fluid drained and further management there Continue conservative management of rest, ice, and gentle stretches Take ibuprofen as needed for pain relief (may cause abdominal discomfort, ulcers, and GI bleeds avoid taking with other NSAIDs) Follow up with PCP if symptoms persist Return or go to the ER if you have any new or worsening symptoms (fever, chills, chest pain, abdominal pain, changes in bowel or bladder habits, pain radiating into lower legs)   Reviewed expectations re: course of current medical issues. Questions answered. Outlined signs and symptoms indicating need for more acute intervention. Patient verbalized understanding. After Visit Summary given.       Moshe Cipro, NP 10/23/20 [phone]

## 2020-10-26 ENCOUNTER — Encounter: Payer: Self-pay | Admitting: Orthopaedic Surgery

## 2020-10-26 ENCOUNTER — Ambulatory Visit: Payer: Managed Care, Other (non HMO) | Admitting: Orthopaedic Surgery

## 2020-11-07 ENCOUNTER — Other Ambulatory Visit: Payer: Self-pay

## 2020-11-07 ENCOUNTER — Ambulatory Visit (INDEPENDENT_AMBULATORY_CARE_PROVIDER_SITE_OTHER): Payer: PRIVATE HEALTH INSURANCE | Admitting: Orthopaedic Surgery

## 2020-11-07 ENCOUNTER — Encounter: Payer: Self-pay | Admitting: Orthopaedic Surgery

## 2020-11-07 VITALS — BP 151/94 | HR 82 | Ht 74.0 in | Wt 303.0 lb

## 2020-11-07 DIAGNOSIS — G8929 Other chronic pain: Secondary | ICD-10-CM

## 2020-11-07 DIAGNOSIS — M25562 Pain in left knee: Secondary | ICD-10-CM | POA: Diagnosis not present

## 2020-11-07 DIAGNOSIS — M1A09X0 Idiopathic chronic gout, multiple sites, without tophus (tophi): Secondary | ICD-10-CM

## 2020-11-07 DIAGNOSIS — S96912A Strain of unspecified muscle and tendon at ankle and foot level, left foot, initial encounter: Secondary | ICD-10-CM | POA: Diagnosis not present

## 2020-11-07 MED ORDER — PREDNISONE 5 MG (21) PO TBPK: ORAL_TABLET | ORAL | 0 refills | Status: DC

## 2020-11-07 MED ORDER — ALLOPURINOL 300 MG PO TABS: 300.0000 mg | ORAL_TABLET | Freq: Every day | ORAL | 5 refills | Status: AC

## 2020-11-07 NOTE — Progress Notes (Signed)
Subjective:    Patient ID: Randy Beltran, male    DOB: [birth year], 45 y.o.   MRN: [id number]  HPI He had pain in the left knee and left ankle which got much worse so that he went to the Urgent Care on 10-23-20.  He had swelling of the left knee and of the left ankle.  He was seen and evaluated.  X-rays of the knee showed: IMPRESSION: Large suprapatellar joint effusion. Loose bodies are noted posteriorly. No fracture or dislocation is noted.  And X-rays of the left ankle showed: IMPRESSION: Negative.  He was given prednisone and told to come here.  His left knee is much improved.  His left ankle is tender laterally.  He has no redness.  He has gout but is not taking any medicine daily.  He has no trauma, no redness.  He has left lateral ankle pain.  He says his knee is back to its usual state of some pain, some arthritis.  I have reviewed the notes from Urgent Care.  I have independently reviewed and interpreted x-rays of this patient done at another site by another physician or qualified health professional.     Review of Systems  Constitutional: Positive for activity change.  Musculoskeletal: Positive for arthralgias, gait problem, joint swelling and myalgias.  All other systems reviewed and are negative.  For Review of Systems, all other systems reviewed and are negative.  The following is a summary of the past history medically, past history surgically, known current medicines, social history and family history.  This information is gathered electronically by the computer from prior information and documentation.  I review this each visit and have found including this information at this point in the chart is beneficial and informative.   Past Medical History:  Diagnosis Date  . Gout   . Hypertension     Past Surgical History:  Procedure Laterality Date  . HERNIA REPAIR      Current Outpatient Medications on File Prior to Visit  Medication Sig Dispense  Refill  . lisinopril (PRINIVIL,ZESTRIL) 20 MG tablet Take 1 tablet (20 mg total) by mouth daily. 30 tablet 0  . lovastatin (MEVACOR) 20 MG tablet Take 20 mg by mouth at bedtime.     No current facility-administered medications on file prior to visit.    Social History   Socioeconomic History  . Marital status: Legally Separated    Spouse name: Not on file  . Number of children: Not on file  . Years of education: Not on file  . Highest education level: Not on file  Occupational History  . Not on file  Tobacco Use  . Smoking status: Current Every Day Smoker    Packs/day: 0.50    Years: 5.00    Pack years: 2.50    Types: Cigarettes  . Smokeless tobacco: Never Used  Substance and Sexual Activity  . Alcohol use: Yes    Comment: once aweek  . Drug use: No  . Sexual activity: Not on file  Other Topics Concern  . Not on file  Social History Narrative  . Not on file   Social Determinants of Health   Financial Resource Strain: Not on file  Food Insecurity: Not on file  Transportation Needs: Not on file  Physical Activity: Not on file  Stress: Not on file  Social Connections: Not on file  Intimate Partner Violence: Not on file    Family History  Problem Relation Age of Onset  . Diabetes  Other     BP (!) 151/94   Pulse 82   Ht 6\' 2"  (1.88 m)   Wt (!) 303 lb (137.4 kg)   BMI 38.90 kg/m   Body mass index is 38.9 kg/m.      Objective:   Physical Exam Vitals and nursing note reviewed. Exam conducted with a chaperone present.  Constitutional:      Appearance: He is well-developed and well-nourished.  HENT:     Head: Normocephalic and atraumatic.  Eyes:     Extraocular Movements: EOM normal.     Conjunctiva/sclera: Conjunctivae normal.     Pupils: Pupils are equal, round, and reactive to light.  Cardiovascular:     Rate and Rhythm: Normal rate and regular rhythm.     Pulses: Intact distal pulses.  Pulmonary:     Effort: Pulmonary effort is normal.   Abdominal:     Palpations: Abdomen is soft.  Musculoskeletal:     Cervical back: Normal range of motion and neck supple.       Legs:       Feet:  Skin:    General: Skin is warm and dry.  Neurological:     Mental Status: He is alert and oriented to person, place, and time.     Cranial Nerves: No cranial nerve deficit.     Motor: No abnormal muscle tone.     Coordination: Coordination normal.     Deep Tendon Reflexes: Reflexes are normal and symmetric. Reflexes normal.  Psychiatric:        Mood and Affect: Mood and affect normal.        Behavior: Behavior normal.        Thought Content: Thought content normal.        Judgment: Judgment normal.           Assessment & Plan:   Encounter Diagnoses  Name Primary?  . Strain of left ankle, initial encounter Yes  . Chronic pain of left knee   . Idiopathic chronic gout of multiple sites without tophus    I will begin allopurinol 300 mgm daily.  I have explained use of contrast baths and given sheet of instruction.    Left ankle put in ankle brace.  Begin prednisone dose pack.  Return in one week.  He may eventually need arthroscopy of the left knee.  Call if any problem.  Precautions discussed.   Electronically Signed , MD 2/15/202210:07 AM

## 2020-11-14 ENCOUNTER — Other Ambulatory Visit: Payer: Self-pay

## 2020-11-14 ENCOUNTER — Ambulatory Visit (INDEPENDENT_AMBULATORY_CARE_PROVIDER_SITE_OTHER): Payer: PRIVATE HEALTH INSURANCE | Admitting: Orthopaedic Surgery

## 2020-11-14 ENCOUNTER — Encounter: Payer: Self-pay | Admitting: Orthopaedic Surgery

## 2020-11-14 VITALS — BP 140/96 | HR 72 | Ht 74.0 in | Wt 302.4 lb

## 2020-11-14 DIAGNOSIS — S96912D Strain of unspecified muscle and tendon at ankle and foot level, left foot, subsequent encounter: Secondary | ICD-10-CM

## 2020-11-14 DIAGNOSIS — S96912A Strain of unspecified muscle and tendon at ankle and foot level, left foot, initial encounter: Secondary | ICD-10-CM

## 2020-11-14 DIAGNOSIS — M1A09X0 Idiopathic chronic gout, multiple sites, without tophus (tophi): Secondary | ICD-10-CM

## 2020-11-14 NOTE — Progress Notes (Signed)
I am better  He is taking the allopurinol.  He had good results with the prednisone.  His left knee is not hurting.  His left ankle is much improved.  He is walking well.  NV intact.  ROM full.  Encounter Diagnoses  Name Primary?  . Strain of left ankle, initial encounter Yes  . Idiopathic chronic gout of multiple sites without tophus    Continue the allopurinol daily.  Return as needed.  Call if any problem.  Precautions discussed.   Electronically Signed Darreld Mclean, MD 2/22/20228:17 AM

## 2021-05-08 ENCOUNTER — Ambulatory Visit
Admission: EM | Admit: 2021-05-08 | Discharge: 2021-05-08 | Disposition: A | Discharge status: Home / Self Care | Payer: PRIVATE HEALTH INSURANCE

## 2021-05-08 ENCOUNTER — Other Ambulatory Visit: Payer: Self-pay

## 2021-05-08 DIAGNOSIS — T464X5A Adverse effect of angiotensin-converting-enzyme inhibitors, initial encounter: Secondary | ICD-10-CM | POA: Diagnosis not present

## 2021-05-08 DIAGNOSIS — T783XXA Angioneurotic edema, initial encounter: Secondary | ICD-10-CM | POA: Diagnosis not present

## 2021-05-08 MED ORDER — DEXAMETHASONE SODIUM PHOSPHATE 10 MG/ML IJ SOLN
10.0000 mg | Freq: Once | INTRAMUSCULAR | Status: AC
  Administered 2021-05-08: 10 mg via INTRAMUSCULAR

## 2021-05-08 MED ORDER — HYDROCHLOROTHIAZIDE 50 MG PO TABS: 50.0000 mg | ORAL_TABLET | Freq: Every day | ORAL | 0 refills | Status: AC

## 2021-05-08 MED ORDER — PREDNISONE 20 MG PO TABS: 40.0000 mg | ORAL_TABLET | Freq: Every day | ORAL | 0 refills | Status: AC

## 2021-05-08 NOTE — ED Triage Notes (Signed)
Pt presents with swelling to right upper lip  that began last night, unsure if insect bite

## 2021-05-08 NOTE — Discharge Instructions (Addendum)
Stop Lisinopril.  Increased Hydrochlorothiazide 50 mg (sent over new prescription to pharmacy).  Call your primary doctor to schedule follow-up in no more than 2 weeks for blood pressure evaluation given the changes that Omnican today. If you develop any further facial swelling, scratchy throat or inability to swallow, or lip swelling or your upper lip does not improve with treatment go immediately to the emergency department as this can indicate a medical emergency related to reaction from lisinopril as the medication does not immediately link your system. Start oral prednisone tomorrow you will take 40 mg daily for the next 3 days.  Your sugar will increase while on this medication so refrain from intake of any sugary beverages or any high carb foods while taking the prednisone.  Be sure to drink plenty of water.

## 2021-05-08 NOTE — ED Provider Notes (Signed)
[facility]    CSN: [id number] Arrival date & time: 05/08/21  1017      History   Chief Complaint Chief Complaint  Patient presents with  . Oral Swelling    HPI Randy Beltran is a 45 y.o. male.   HPI Patient presents today with upper lip edema which initially began as a tingling sensation and upon awakening this morning upper lip is edematous. Denies any difficulty swallowing or itchy throat. Patient is prescribed lisinopril for management of blood pressure.  Denies any prior reaction with ACE inhibitors. No chronic cough.  Denies active SOB or chest pain. Past Medical History:  Diagnosis Date  . Gout   . Hypertension     There are no problems to display for this patient.   Past Surgical History:  Procedure Laterality Date  . HERNIA REPAIR     Home Medications    Prior to Admission medications   Medication Sig Start Date End Date Taking? Authorizing Provider  allopurinol (ZYLOPRIM) 300 MG tablet Take 1 tablet (300 mg total) by mouth daily. 11/07/20   Darreld Mclean, MD  hydrochlorothiazide (HYDRODIURIL) 25 MG tablet Take 25 mg by mouth daily. 05/03/21   [provider]  lisinopril (PRINIVIL,ZESTRIL) 20 MG tablet Take 1 tablet (20 mg total) by mouth daily. 11/29/17   Devoria Albe, MD  lovastatin (MEVACOR) 20 MG tablet Take 20 mg by mouth at bedtime. Patient not taking: Reported on 11/14/2020    [provider]  metFORMIN (GLUCOPHAGE) 1000 MG tablet Take 1,000 mg by mouth 2 (two) times daily. 05/03/21   [provider]  predniSONE (STERAPRED UNI-PAK 21 TAB) 5 MG (21) TBPK tablet Take 6 pills first day; 5 pills second day; 4 pills third day; 3 pills fourth day; 2 pills next day and 1 pill last day. Patient not taking: Reported on 11/14/2020 11/07/20   Darreld Mclean, MD    Family History Family History  Problem Relation Age of Onset  . Diabetes Other     Social History Social History   Tobacco Use  . Smoking status: Former     Packs/day: 0.50    Years: 5.00    Pack years: 2.50    Types: Cigarettes    Quit date: 07/2019    Years since quitting: 1.7  . Smokeless tobacco: Never  Substance Use Topics  . Alcohol use: Yes    Comment: once aweek  . Drug use: No     Allergies   Penicillins   Review of Systems Review of Systems Pertinent negatives listed in HPI  Physical Exam Triage Vital Signs ED Triage Vitals  Enc Vitals Group     BP 05/08/21 1047 139/86     Pulse Rate 05/08/21 1047 87     Resp 05/08/21 1047 20     Temp 05/08/21 1047 98.1 F (36.7 C)     Temp src --      SpO2 05/08/21 1047 97 %     Weight --      Height --      Head Circumference --      Peak Flow --      Pain Score 05/08/21 1045 0     Pain Loc --      Pain Edu? --      Excl. in GC? --    No data found.  Updated Vital Signs BP 139/86   Pulse 87   Temp 98.1 F (36.7 C)   Resp 20   SpO2 97%  Visual Acuity Right Eye Distance:   Left Eye Distance:   Bilateral Distance:    Right Eye Near:   Left Eye Near:    Bilateral Near:     Physical Exam Constitutional:      Appearance: Normal appearance.  HENT:     Head: Normocephalic and atraumatic.     Mouth/Throat:   Cardiovascular:     Rate and Rhythm: Normal rate and regular rhythm.  Pulmonary:     Effort: Pulmonary effort is normal.     Breath sounds: Normal breath sounds. No wheezing.  Musculoskeletal:     Cervical back: Normal range of motion.  Skin:    Capillary Refill: Capillary refill takes less than 2 seconds.  Neurological:     General: No focal deficit present.     Mental Status: He is alert and oriented to person, place, and time.  Psychiatric:        Mood and Affect: Mood normal.        Behavior: Behavior normal.        Thought Content: Thought content normal.        Judgment: Judgment normal.     UC Treatments / Results  Labs (all labs ordered are listed, but only abnormal results are displayed) Labs Reviewed - No data to  display  EKG   Radiology No results found.  Procedures Procedures (including critical care time)  Medications Ordered in UC Medications - No data to display  Initial Impression / Assessment and Plan / UC Course  I have reviewed the triage vital signs and the nursing notes.  Pertinent labs & imaging results that were available during my care of the patient were reviewed by me and considered in my medical decision making (see chart for details).   Given acute onset angioedema, suspect this is related to ACE inhibitor reaction. Discontinue ACE. Continue other medications. Increased HCTZ 50 mg due to d/c of ACE. Advised to follow-up with PCP in 2-4 weeks for blood pressure check. Decadron IM given here in clinic. Strict ER precautions given if your symptoms worsen or do not readily improve. Educated angioedema can result in a medical emergency. Patient verbalized understanding and agreement with plan  Final Clinical Impressions(s) / UC Diagnoses   Final diagnoses:  Angiotensin converting enzyme inhibitor-aggravated angioedema, initial encounter  Angioedema, initial encounter   Discharge Instructions   None    ED Prescriptions     Medication Sig Dispense Auth. Provider   predniSONE (DELTASONE) 20 MG tablet Take 2 tablets (40 mg total) by mouth daily with breakfast. 10 tablet Bing Neighbors, FNP   hydrochlorothiazide (HYDRODIURIL) 50 MG tablet Take 1 tablet (50 mg total) by mouth daily. 30 tablet Bing Neighbors, FNP      PDMP not reviewed this encounter.   Bing Neighbors, [place] 05/15/21 [phone]

## 2022-02-02 ENCOUNTER — Encounter (HOSPITAL_COMMUNITY): Payer: Self-pay | Admitting: Emergency Medicine

## 2022-02-02 ENCOUNTER — Emergency Department (HOSPITAL_COMMUNITY)
Admission: EM | Admit: 2022-02-02 | Discharge: 2022-02-02 | Disposition: A | Discharge status: Home / Self Care | Payer: 59 | Attending: Emergency Medicine | Admitting: Emergency Medicine

## 2022-02-02 ENCOUNTER — Other Ambulatory Visit: Payer: Self-pay

## 2022-02-02 DIAGNOSIS — T7840XA Allergy, unspecified, initial encounter: Secondary | ICD-10-CM | POA: Diagnosis present

## 2022-02-02 DIAGNOSIS — L0100 Impetigo, unspecified: Secondary | ICD-10-CM | POA: Insufficient documentation

## 2022-02-02 MED ORDER — CLARITHROMYCIN 500 MG PO TABS: 500.0000 mg | ORAL_TABLET | Freq: Two times a day (BID) | ORAL | 0 refills | Status: AC

## 2022-02-02 MED ORDER — CLARITHROMYCIN 500 MG PO TABS
500.0000 mg | ORAL_TABLET | Freq: Once | ORAL | Status: AC
  Administered 2022-02-02: 500 mg via ORAL
  Filled 2022-02-02 (×2): qty 1

## 2022-02-02 NOTE — ED Triage Notes (Signed)
Pt with c/o swelling to nose and L upper cheek area.  ?

## 2022-02-02 NOTE — ED Provider Notes (Signed)
?[facility] ?Provider Note ? ? ?CSN: [id number] ?Arrival date & time: 02/02/22  0058 ? ?  ? ?History ? ?Chief Complaint  ?Patient presents with  ? Allergic Reaction  ? ? ?Randy Beltran is a 46 y.o. male. ? ?Patient has swelling to the right side of his nose and the left side of his nose that started within the last 24 hours.  Little bit of clear yellow drainage.  Little bit tender to touch in pain with facial movements.  No history of the same.  Does have a history of allergic reaction (angioedema) to an ACE inhibitor.  No sick contacts.  No fevers. ? ? ?Allergic Reaction ? ?  ? ?Home Medications ?Prior to Admission medications   ?Medication Sig Start Date End Date Taking? Authorizing Provider  ?clarithromycin (BIAXIN) 500 MG tablet Take 1 tablet (500 mg total) by mouth 2 (two) times daily for 10 days. 02/02/22 02/12/22 Yes Ciel Chervenak, Corene Cornea, MD  ?allopurinol (ZYLOPRIM) 300 MG tablet Take 1 tablet (300 mg total) by mouth daily. 11/07/20   Sanjuana Kava, MD  ?hydrochlorothiazide (HYDRODIURIL) 50 MG tablet Take 1 tablet (50 mg total) by mouth daily. 05/08/21   Scot Jun, FNP  ?lovastatin (MEVACOR) 20 MG tablet Take 20 mg by mouth at bedtime. ?Patient not taking: Reported on 11/14/2020    [provider]  ?metFORMIN (GLUCOPHAGE) 1000 MG tablet Take 1,000 mg by mouth 2 (two) times daily. 05/03/21   [provider]  ?predniSONE (DELTASONE) 20 MG tablet Take 2 tablets (40 mg total) by mouth daily with breakfast. 05/08/21   Scot Jun, FNP  ?   ? ?Allergies    ?Ace inhibitors and Penicillins   ? ?Review of Systems   ?Review of Systems ? ?Physical Exam ?Updated Vital Signs ?BP (!) 157/106   Pulse 94   Temp 98.1 ?F (36.7 ?C) (Oral)   Resp 20   Ht 6\' 2"  (1.88 m)   Wt 124.7 kg   SpO2 97%   BMI 35.31 kg/m?  ?Physical Exam ?Vitals and nursing note reviewed.  ?Constitutional:   ?   Appearance: He is well-developed.  ?HENT:  ?   Head: Normocephalic and atraumatic.  ? ?    Comments: Patient has 2 areas of mild erythema with associated white vesicular lesions a couple of which are draining some honey colored clear liquid.  ?   Mouth/Throat:  ?   Mouth: Mucous membranes are moist.  ?   Pharynx: Oropharynx is clear.  ?Eyes:  ?   Pupils: Pupils are equal, round, and reactive to light.  ?Cardiovascular:  ?   Rate and Rhythm: Normal rate.  ?Pulmonary:  ?   Effort: Pulmonary effort is normal. No respiratory distress.  ?Abdominal:  ?   General: There is no distension.  ?Musculoskeletal:     ?   General: Normal range of motion.  ?   Cervical back: Normal range of motion.  ?Neurological:  ?   Mental Status: He is alert.  ? ? ?ED Results / Procedures / Treatments   ?Labs ?(all labs ordered are listed, but only abnormal results are displayed) ?Labs Reviewed - No data to display ? ?EKG ?None ? ?Radiology ?No results found. ? ?Procedures ?Procedures  ? ? ?Medications Ordered in ED ?Medications  ?clarithromycin (BIAXIN) tablet 500 mg (has no administration in time range)  ? ? ?ED Course/ Medical Decision Making/ A&P ?  ?                        ?  Medical Decision Making ? ?Suspect impetigo.  Will initiate antibiotics.  Doubt allergic reaction.  Stable for discharge. ? ?Final Clinical Impression(s) / ED Diagnoses ?Final diagnoses:  ?Impetigo  ? ? ?Rx / DC Orders ?ED Discharge Orders   ? ?      Ordered  ?  clarithromycin (BIAXIN) 500 MG tablet  2 times daily       ? 02/02/22 0431  ? ?  ?  ? ?  ? ? ?  ?Merrily Pew, MD ?02/02/22 [phone] ? ?

## 2022-03-18 ENCOUNTER — Encounter: Payer: Self-pay | Admitting: Urology

## 2022-03-18 ENCOUNTER — Ambulatory Visit (INDEPENDENT_AMBULATORY_CARE_PROVIDER_SITE_OTHER): Payer: PRIVATE HEALTH INSURANCE | Admitting: Urology

## 2022-03-18 VITALS — BP 145/89 | HR 101 | Ht 74.0 in | Wt 290.0 lb

## 2022-03-18 DIAGNOSIS — R339 Retention of urine, unspecified: Secondary | ICD-10-CM | POA: Diagnosis not present

## 2022-03-18 DIAGNOSIS — R3911 Hesitancy of micturition: Secondary | ICD-10-CM

## 2022-03-18 LAB — URINALYSIS, ROUTINE W REFLEX MICROSCOPIC
Bilirubin, UA: NEGATIVE
Glucose, UA: NEGATIVE
Ketones, UA: NEGATIVE
Leukocytes,UA: NEGATIVE
Nitrite, UA: NEGATIVE
Protein,UA: NEGATIVE
RBC, UA: NEGATIVE
Specific Gravity, UA: 1.025 (ref 1.005–1.030)
Urobilinogen, Ur: 2 mg/dL — ABNORMAL HIGH (ref 0.2–1.0)
pH, UA: 5 (ref 5.0–7.5)

## 2022-03-18 LAB — BLADDER SCAN AMB NON-IMAGING: Scan Result: 8

## 2022-03-18 NOTE — Progress Notes (Signed)
post void residual =8mg 

## 2022-05-16 ENCOUNTER — Emergency Department (HOSPITAL_COMMUNITY): Payer: 59

## 2022-05-16 ENCOUNTER — Emergency Department (HOSPITAL_COMMUNITY)
Admission: EM | Admit: 2022-05-16 | Discharge: 2022-05-16 | Disposition: A | Discharge status: Home / Self Care | Payer: 59 | Attending: Emergency Medicine | Admitting: Emergency Medicine

## 2022-05-16 ENCOUNTER — Other Ambulatory Visit: Payer: Self-pay

## 2022-05-16 ENCOUNTER — Encounter (HOSPITAL_COMMUNITY): Payer: Self-pay | Admitting: Emergency Medicine

## 2022-05-16 DIAGNOSIS — N3001 Acute cystitis with hematuria: Secondary | ICD-10-CM | POA: Insufficient documentation

## 2022-05-16 DIAGNOSIS — R319 Hematuria, unspecified: Secondary | ICD-10-CM | POA: Diagnosis present

## 2022-05-16 LAB — COMPREHENSIVE METABOLIC PANEL
ALT: 31 U/L (ref 0–44)
AST: 24 U/L (ref 15–41)
Albumin: 3.9 g/dL (ref 3.5–5.0)
Alkaline Phosphatase: 64 U/L (ref 38–126)
Anion gap: 9 (ref 5–15)
BUN: 9 mg/dL (ref 6–20)
CO2: 25 mmol/L (ref 22–32)
Calcium: 9.1 mg/dL (ref 8.9–10.3)
Chloride: 100 mmol/L (ref 98–111)
Creatinine, Ser: 0.88 mg/dL (ref 0.61–1.24)
GFR, Estimated: >60 mL/min (ref >60)
Glucose, Bld: 150 mg/dL — ABNORMAL HIGH (ref 70–99)
Potassium: 3.3 mmol/L — ABNORMAL LOW (ref 3.5–5.1)
Sodium: 134 mmol/L — ABNORMAL LOW (ref 135–145)
Total Bilirubin: 0.7 mg/dL (ref 0.3–1.2)
Total Protein: 8 g/dL (ref 6.5–8.1)

## 2022-05-16 LAB — URINALYSIS, ROUTINE W REFLEX MICROSCOPIC
Bacteria, UA: NONE SEEN
Bilirubin Urine: NEGATIVE
Glucose, UA: NEGATIVE mg/dL
Ketones, ur: NEGATIVE mg/dL
Nitrite: NEGATIVE
Protein, ur: 100 mg/dL — AB
RBC / HPF: >50 RBC/hpf — ABNORMAL HIGH (ref 0–5)
Specific Gravity, Urine: 1.016 (ref 1.005–1.030)
WBC, UA: >50 WBC/hpf — ABNORMAL HIGH (ref 0–5)
pH: 6 (ref 5.0–8.0)

## 2022-05-16 LAB — CBC WITH DIFFERENTIAL/PLATELET
Abs Immature Granulocytes: 0.05 10*3/uL (ref 0.00–0.07)
Basophils Absolute: 0.1 10*3/uL (ref 0.0–0.1)
Basophils Relative: 1 %
Eosinophils Absolute: 0 10*3/uL (ref 0.0–0.5)
Eosinophils Relative: 0 %
HCT: 42.3 % (ref 39.0–52.0)
Hemoglobin: 14.4 g/dL (ref 13.0–17.0)
Immature Granulocytes: 1 %
Lymphocytes Relative: 16 %
Lymphs Abs: 1.8 10*3/uL (ref 0.7–4.0)
MCH: 30.2 pg (ref 26.0–34.0)
MCHC: 34 g/dL (ref 30.0–36.0)
MCV: 88.7 fL (ref 80.0–100.0)
Monocytes Absolute: 0.9 10*3/uL (ref 0.1–1.0)
Monocytes Relative: 8 %
Neutro Abs: 8.2 10*3/uL — ABNORMAL HIGH (ref 1.7–7.7)
Neutrophils Relative %: 74 %
Platelets: 259 10*3/uL (ref 150–400)
RBC: 4.77 MIL/uL (ref 4.22–5.81)
RDW: 12.6 % (ref 11.5–15.5)
WBC: 11.1 10*3/uL — ABNORMAL HIGH (ref 4.0–10.5)
nRBC: 0 % (ref 0.0–0.2)

## 2022-05-16 LAB — LIPASE, BLOOD: Lipase: 38 U/L (ref 11–51)

## 2022-05-16 MED ORDER — SULFAMETHOXAZOLE-TRIMETHOPRIM 800-160 MG PO TABS: 1.0000 | ORAL_TABLET | Freq: Two times a day (BID) | ORAL | 0 refills | Status: AC

## 2022-05-16 NOTE — ED Triage Notes (Signed)
Pt with low back pain for 3 days and hematuria this morning.  No fever/chills.  No abdominal pain.

## 2022-05-16 NOTE — ED Notes (Signed)
Discharge instructions reviewed with patient. Patient denies any questions or concerns. Patient ambulatory out of ED. 

## 2022-05-16 NOTE — ED Provider Triage Note (Signed)
Emergency Medicine Provider Triage Evaluation Note  Randy Beltran , a 46 y.o. male  was evaluated in triage.  Pt complains of hematuria and dysuria, low back pain.  Patient states that He has had low back pain for the past 3 days.  He noticed hematuria this morning.  He also complains of some difficulty and pain with urination this morning.  He denies fever, chills, nausea, vomiting, abdominal pain Review of Systems  Positive: As above Negative: As above  Physical Exam  BP (!) 145/94 (BP Location: Right Arm)   Pulse (!) 114   Temp 99.8 F (37.7 C) (Oral)   Resp 16   SpO2 93%  Gen:   Awake, no distress   Resp:  Normal effort  MSK:   Moves extremities without difficulty  Other:    Medical Decision Making  Medically screening exam initiated at 9:22 AM.  Appropriate orders placed.  Randy Beltran was informed that the remainder of the evaluation will be completed by another provider, this initial triage assessment does not replace that evaluation, and the importance of remaining in the ED until their evaluation is complete.     Darrick Grinder, [place] 05/16/22 [phone]

## 2022-05-16 NOTE — ED Provider Notes (Signed)
[facility] EMERGENCY DEPARTMENT Provider Note   CSN: [id number] Arrival date & time: 05/16/22  7494     History  Chief Complaint  Patient presents with   Hematuria    Randy Beltran is a 46 y.o. male.  Patient is a 46 year old male who presents with hematuria.  He said for the last 2 days he has had some specks of blood in his urine.  He says its mostly clear but has specks of blood.  He has a little bit of burning at the end of urination.  He has a little bit of a slower stream but says he is able to get his urine out without difficulty.  No abdominal pain.  No back pain.  No fevers.  No nausea or vomiting.  No history of similar symptoms in the past.  He is circumcised.  No penile discharge.       Home Medications Prior to Admission medications   Medication Sig Start Date End Date Taking? Authorizing Provider  sulfamethoxazole-trimethoprim (BACTRIM DS) 800-160 MG tablet Take 1 tablet by mouth 2 (two) times daily for 7 days. 05/16/22 05/23/22 Yes Rolan Bucco, MD  allopurinol (ZYLOPRIM) 300 MG tablet Take 1 tablet (300 mg total) by mouth daily. 11/07/20   Darreld Mclean, MD  amLODipine (NORVASC) 10 MG tablet Take 10 mg by mouth daily. 03/04/22   [provider]  hydrochlorothiazide (HYDRODIURIL) 50 MG tablet Take 1 tablet (50 mg total) by mouth daily. 05/08/21   Bing Neighbors, FNP  lovastatin (MEVACOR) 20 MG tablet Take 20 mg by mouth at bedtime.    [provider]  metFORMIN (GLUCOPHAGE) 1000 MG tablet Take 1,000 mg by mouth 2 (two) times daily. 05/03/21   [provider]  predniSONE (DELTASONE) 20 MG tablet Take 2 tablets (40 mg total) by mouth daily with breakfast. Patient not taking: Reported on 03/18/2022 05/08/21   Bing Neighbors, FNP  tadalafil (CIALIS) 10 MG tablet Take 10 mg by mouth daily. 03/11/22   [provider]      Allergies    Ace inhibitors and Penicillins    Review of Systems   Review of Systems   Constitutional:  Negative for chills, diaphoresis, fatigue and fever.  HENT:  Negative for congestion, rhinorrhea and sneezing.   Eyes: Negative.   Respiratory:  Negative for cough, chest tightness and shortness of breath.   Cardiovascular:  Negative for chest pain and leg swelling.  Gastrointestinal:  Negative for abdominal pain, blood in stool, diarrhea, nausea and vomiting.  Genitourinary:  Positive for dysuria and hematuria. Negative for difficulty urinating, flank pain and frequency.  Musculoskeletal:  Negative for arthralgias and back pain.  Skin:  Negative for rash.  Neurological:  Negative for dizziness, speech difficulty, weakness, numbness and headaches.    Physical Exam Updated Vital Signs BP (!) 127/93   Pulse 87   Temp 99.8 F (37.7 C) (Oral)   Resp 17   SpO2 99%  Physical Exam Constitutional:      Appearance: He is well-developed.  HENT:     Head: Normocephalic and atraumatic.  Eyes:     Pupils: Pupils are equal, round, and reactive to light.  Cardiovascular:     Rate and Rhythm: Normal rate and regular rhythm.     Heart sounds: Normal heart sounds.  Pulmonary:     Effort: Pulmonary effort is normal. No respiratory distress.     Breath sounds: Normal breath sounds. No wheezing or rales.  Chest:  Chest wall: No tenderness.  Abdominal:     General: Bowel sounds are normal.     Palpations: Abdomen is soft.     Tenderness: There is no abdominal tenderness. There is no guarding or rebound.  Musculoskeletal:        General: Normal range of motion.     Cervical back: Normal range of motion and neck supple.  Lymphadenopathy:     Cervical: No cervical adenopathy.  Skin:    General: Skin is warm and dry.     Findings: No rash.  Neurological:     Mental Status: He is alert and oriented to person, place, and time.     ED Results / Procedures / Treatments   Labs (all labs ordered are listed, but only abnormal results are displayed) Labs Reviewed   URINALYSIS, ROUTINE W REFLEX MICROSCOPIC - Abnormal; Notable for the following components:      Result Value   APPearance CLOUDY (*)    Hgb urine dipstick LARGE (*)    Protein, ur 100 (*)    Leukocytes,Ua LARGE (*)    RBC / HPF >50 (*)    WBC, UA >50 (*)    All other components within normal limits  CBC WITH DIFFERENTIAL/PLATELET - Abnormal; Notable for the following components:   WBC 11.1 (*)    Neutro Abs 8.2 (*)    All other components within normal limits  COMPREHENSIVE METABOLIC PANEL - Abnormal; Notable for the following components:   Sodium 134 (*)    Potassium 3.3 (*)    Glucose, Bld 150 (*)    All other components within normal limits  URINE CULTURE  LIPASE, BLOOD    EKG None  Radiology CT Abdomen Pelvis Wo Contrast  Result Date: 05/16/2022 CLINICAL DATA:  Flank pain, kidney stone suspected EXAM: CT ABDOMEN AND PELVIS WITHOUT CONTRAST TECHNIQUE: Multidetector CT imaging of the abdomen and pelvis was performed following the standard protocol without IV contrast. RADIATION DOSE REDUCTION: This exam was performed according to the departmental dose-optimization program which includes automated exposure control, adjustment of the mA and/or kV according to patient size and/or use of iterative reconstruction technique. COMPARISON:  None Available. FINDINGS: Lower chest: No acute abnormality. Hepatobiliary: Steatosis. Gallbladder is unremarkable. No biliary dilatation. Pancreas: Unremarkable. No pancreatic ductal dilatation or surrounding inflammatory changes. Spleen: Unremarkable. Adrenals/Urinary Tract: Adrenals are unremarkable. No renal calculi. No hydronephrosis. Bladder is partially distended with circumferential wall thickening and some adjacent fat infiltration. Stomach/Bowel: Stomach is within normal limits. Bowel is normal in caliber. Normal appendix. Vascular/Lymphatic: No significant vascular abnormality. No enlarged nodes. Reproductive: Mildly enlarged prostate. Other: No  free fluid or free air.  Abdominal wall is unremarkable. Musculoskeletal: No acute osseous abnormality. IMPRESSION: Circumferential bladder wall thickening may be due to underdistention. Given adjacent mild fat infiltration, recommend correlation for cystitis. No urinary tract calculi. Hepatic steatosis. Electronically Signed   By: Guadlupe Spanish M.D.   On: 05/16/2022 09:54    Procedures Procedures    Medications Ordered in ED Medications - No data to display  ED Course/ Medical Decision Making/ A&P                           Medical Decision Making Amount and/or Complexity of Data Reviewed Labs: ordered.  Risk Prescription drug management.   Patient is a 46 year old male who presents with hematuria.  He has a little bit of burning on urination.  He does not appear to be systemically ill.  He had a CT scan which shows no evidence of kidney stone or renal mass.  Labs show evidence of a urinary tract infection.  His kidney function is normal.  He overall is well-appearing.  I feel that he can be treated with outpatient antibiotics rather than admission.  Urine culture sent.  Patient was started on Bactrim.  Was instructed to follow-up with his urologist.  Return precautions were given.  Final Clinical Impression(s) / ED Diagnoses Final diagnoses:  Acute cystitis with hematuria    Rx / DC Orders ED Discharge Orders          Ordered    sulfamethoxazole-trimethoprim (BACTRIM DS) 800-160 MG tablet  2 times daily        05/16/22 1244              Rolan Bucco, MD 05/16/22 1320

## 2022-05-17 IMAGING — DX DG KNEE COMPLETE 4+V*L*
4 series · 4 of 4 positions shown · non-contrast
Comparison: None.

CLINICAL DATA: Left knee pain without known injury.

EXAM:
LEFT KNEE - COMPLETE 4+ VIEW

[knee ap]
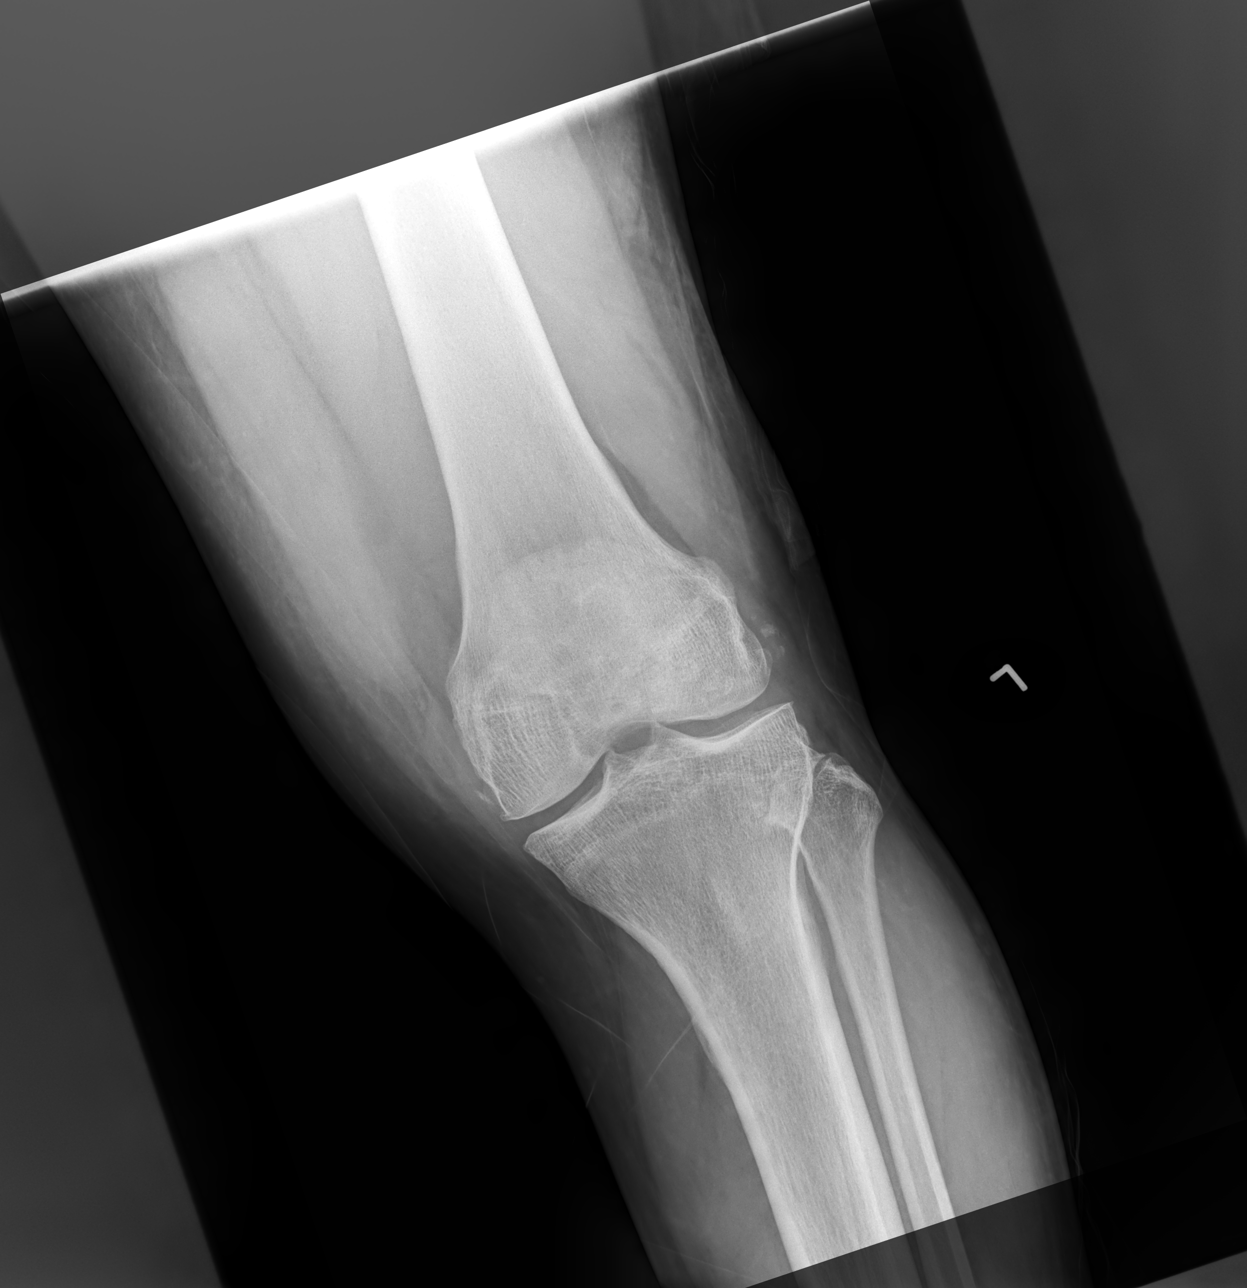

[knee mlo (1 of 2)]
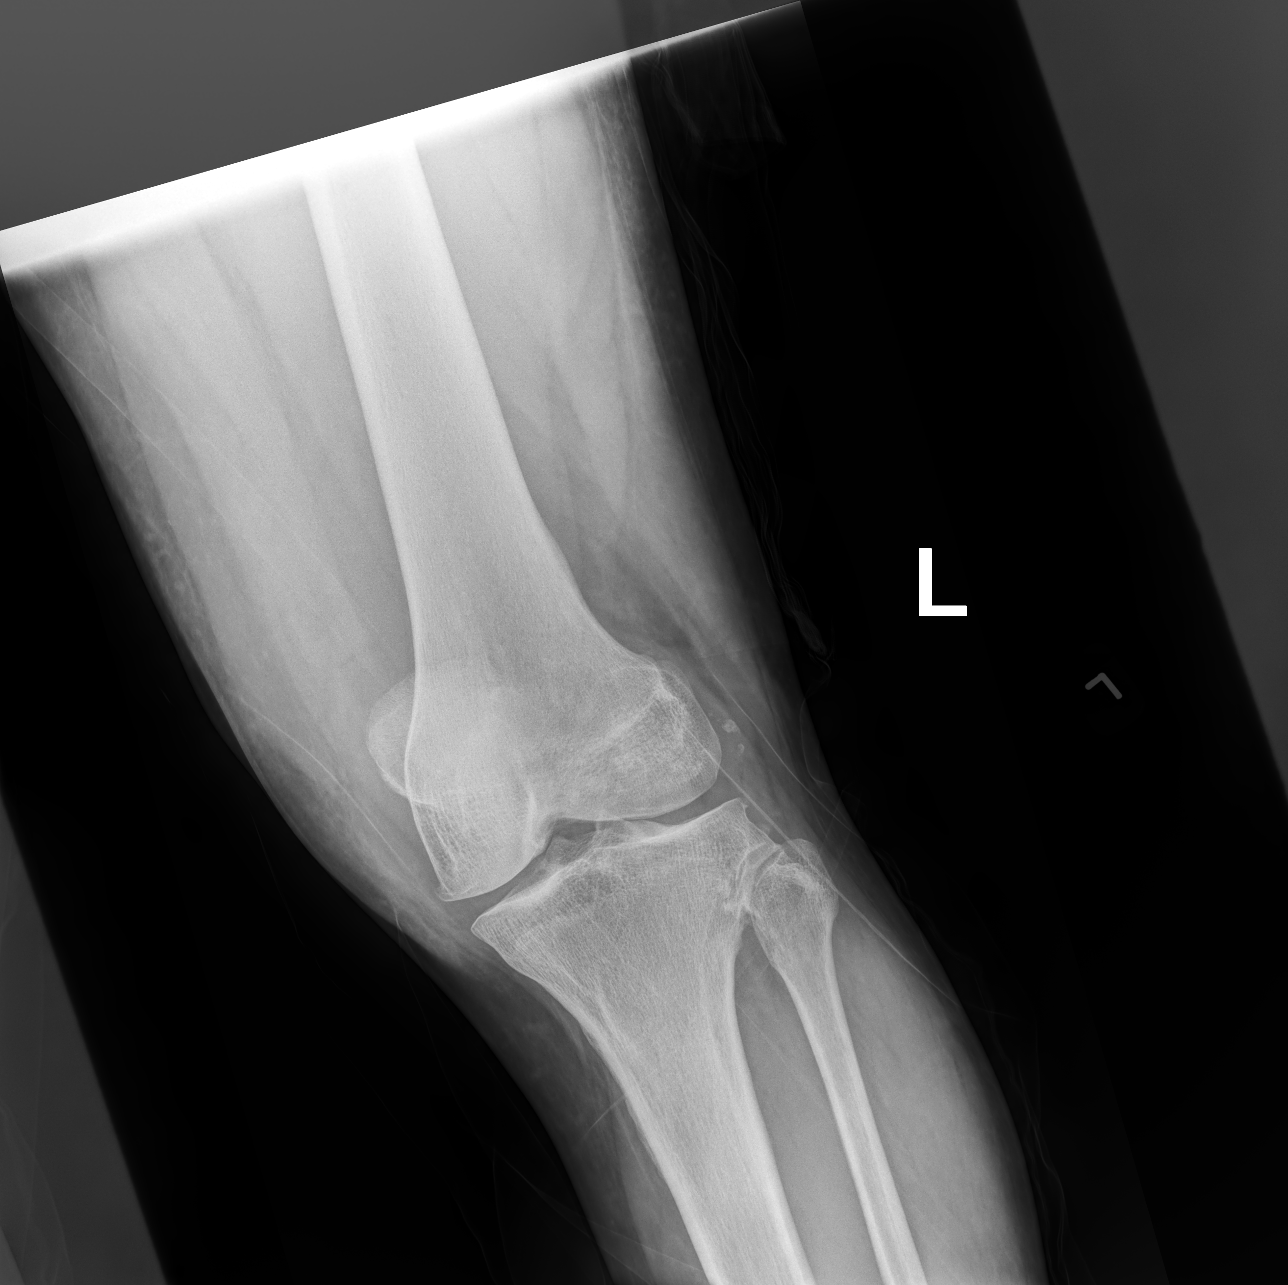

[knee mlo (2 of 2)]
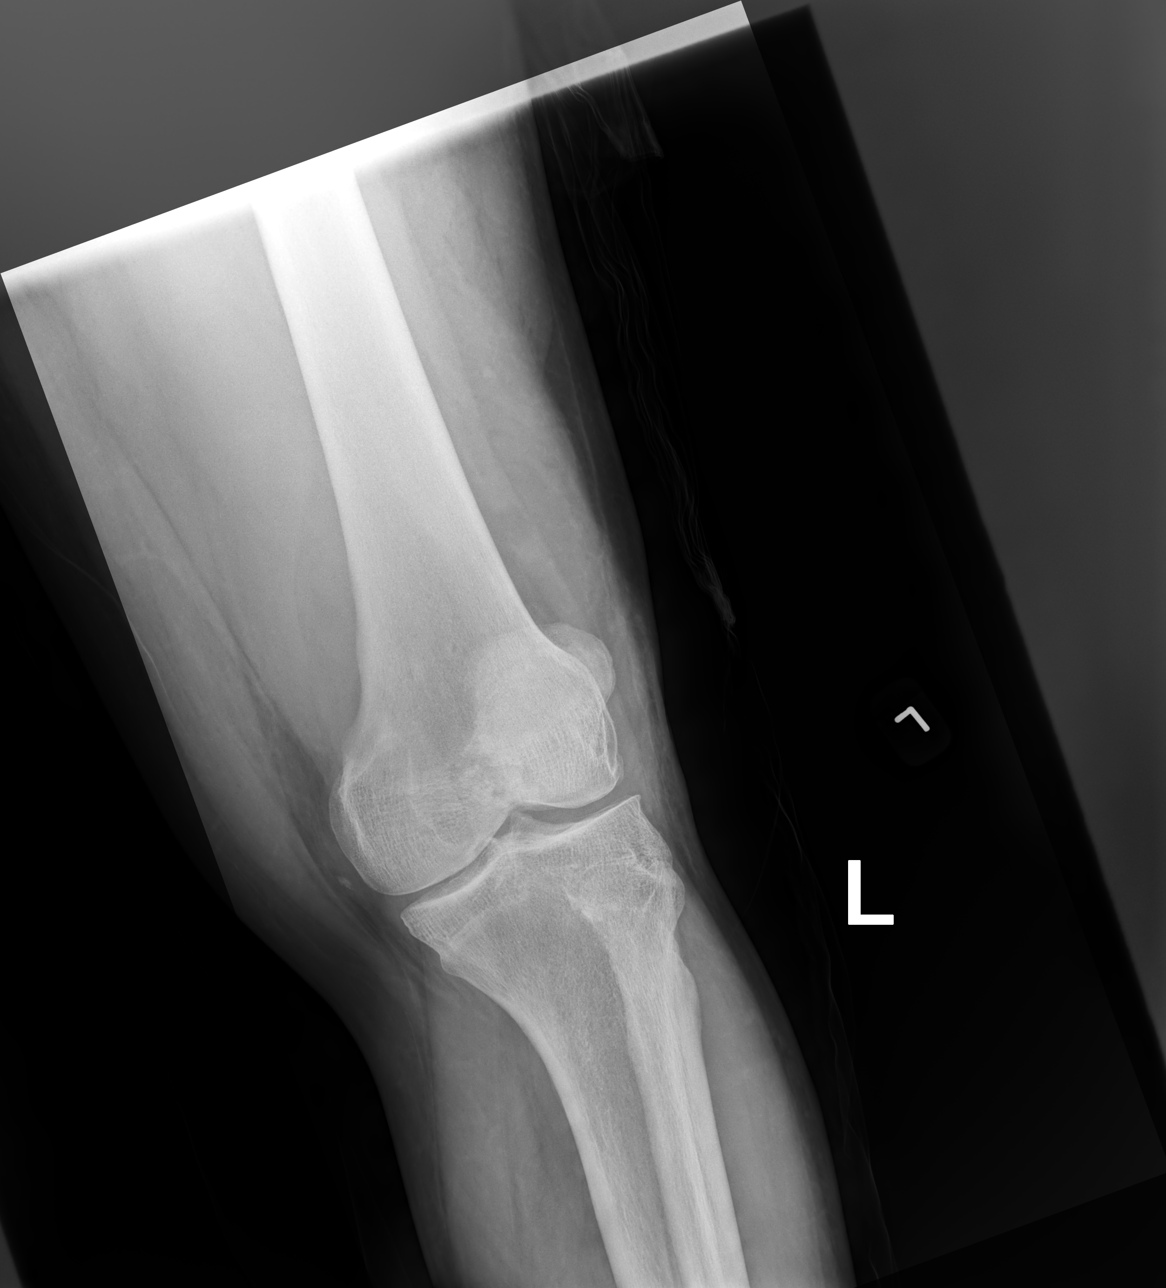

[knee lat]
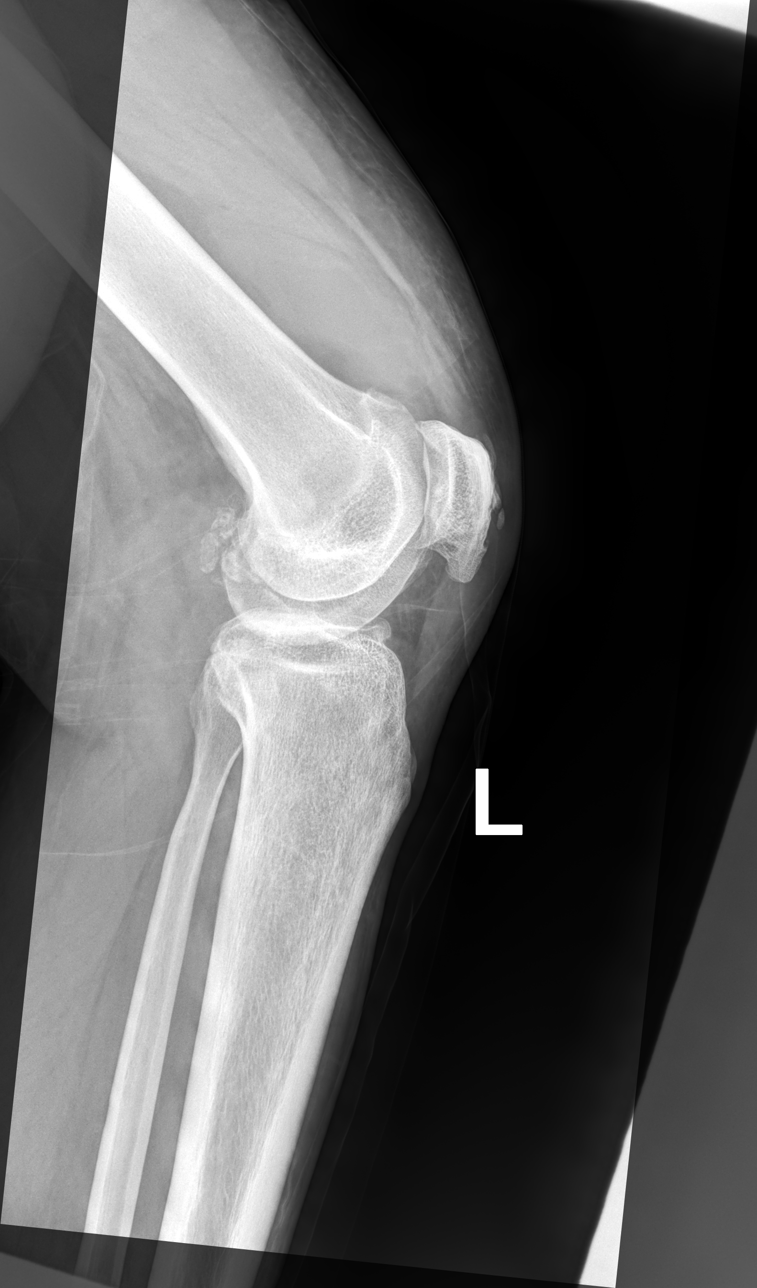

[4 of 4 positions shown; findings below may reference images not displayed]

FINDINGS: No fracture or dislocation is noted. Large suprapatellar joint
effusion is noted. Joint spaces are intact. Loose bodies are noted
posteriorly.
IMPRESSION: Large suprapatellar joint effusion. Loose bodies are noted
posteriorly. No fracture or dislocation is noted.

## 2022-05-18 LAB — URINE CULTURE: Culture: >=100000 — AB

## 2022-05-19 ENCOUNTER — Telehealth: Payer: Self-pay | Admitting: *Deleted

## 2022-05-19 NOTE — Telephone Encounter (Signed)
Post ED Visit - Positive Culture Follow-up  Culture report reviewed by antimicrobial stewardship pharmacist: [facility] []  , Pharm.D. []  Enzo Bi, Pharm.D., BCPS AQ-ID []  , Pharm.D., BCPS []  Celedonio Miyamoto, Pharm.D., BCPS []  [place], Garvin Fila.D., BCPS, AAHIVP []  , Pharm.D., BCPS, AAHIVP []  Georgina Pillion, PharmD, BCPS []  , PharmD, BCPS []  [place], PharmD, BCPS []  [address], PharmD []  , PharmD, BCPS []  Estella Husk, PharmD  Pharmacy Team []  Lysle Pearl, PharmD []  , PharmD []  Phillips Climes, PharmD []  , Rph []  Agapito Games) , PharmD []  Verlan Friends, PharmD []  , PharmD []  Mervyn Gay, PharmD []  , PharmD []  Vinnie Level, PharmD []  [facility], PharmD []  , PharmD []  Len Childs, PharmD   Positive urine culture Treated with Sulfamethoxazole-Trimethoprim, organism sensitive to the same and no further patient follow-up is required at this time. , PharmD  Greer Pickerel Talley 05/19/2022, 12:29 PM

## 2022-10-08 ENCOUNTER — Ambulatory Visit
Admission: EM | Admit: 2022-10-08 | Discharge: 2022-10-08 | Disposition: A | Discharge status: Home / Self Care | Payer: PRIVATE HEALTH INSURANCE | Attending: Nurse Practitioner | Admitting: Nurse Practitioner

## 2022-10-08 DIAGNOSIS — J069 Acute upper respiratory infection, unspecified: Secondary | ICD-10-CM | POA: Insufficient documentation

## 2022-10-08 DIAGNOSIS — Z1152 Encounter for screening for COVID-19: Secondary | ICD-10-CM | POA: Insufficient documentation

## 2022-10-08 MED ORDER — BENZONATATE 100 MG PO CAPS: 100.0000 mg | ORAL_CAPSULE | Freq: Three times a day (TID) | ORAL | 0 refills | Status: AC | PRN

## 2022-10-08 MED ORDER — FLUTICASONE PROPIONATE 50 MCG/ACT NA SUSP: 2.0000 | Freq: Every day | NASAL | 0 refills | Status: AC

## 2022-10-08 NOTE — Discharge Instructions (Addendum)
You have a viral upper respiratory infection.  Symptoms should improve over the next week to 10 days.  If you develop chest pain or shortness of breath, go to the emergency room.   We have tested you today for COVID-19.  You will see the results in Mychart and we will call you with positive results. Please stay home and isolate until you are aware of the results.  If test is positive, you are a candidate to receive paxlovid as antiviral therapy.  Take medication as prescribed.  You can also begin taking over-the-counter Coricidin HBP or Mucinex for people with high blood pressure and diabetes while your symptoms persist.   Some things that can make you feel better are: - Increased rest - Increasing fluid with water/sugar free electrolytes - Acetaminophen and ibuprofen as needed for fever/pain - Salt water gargling, chloraseptic spray and throat lozenges - Saline sinus flushes or a neti pot - Humidifying the air  As discussed, if symptoms do not improve over the next 7 to 10 days, or if they suddenly worsen, please follow-up in this clinic or with your primary care physician for further evaluation.

## 2022-10-08 NOTE — ED Provider Notes (Signed)
[facility]    CSN: [id number] Arrival date & time: 10/08/22  1208      History   Chief Complaint Chief Complaint  Patient presents with   Cough    HPI Randy Beltran is a 47 y.o. male.   The history is provided by the patient.   Presents with a 2-day history of cough and nasal congestion.  Patient denies fever, chills, headache, sore throat, ear pain, wheezing, shortness of breath, difficulty breathing, or GI symptoms.  Patient states that his coworkers have been sick recently.  He states that he has received 3 COVID vaccines, no influenza vaccine.  Patient reports that he has not taken any medication for his symptoms.  Past Medical History:  Diagnosis Date   Gout    Hypertension     There are no problems to display for this patient.   Past Surgical History:  Procedure Laterality Date   HERNIA REPAIR         Home Medications    Prior to Admission medications   Medication Sig Start Date End Date Taking? Authorizing Provider  benzonatate (TESSALON PERLES) 100 MG capsule Take 1 capsule (100 mg total) by mouth 3 (three) times daily as needed for cough. 10/08/22  Yes Sidda Humm-Warren, Sadie Haber, NP  fluticasone (FLONASE) 50 MCG/ACT nasal spray Place 2 sprays into both nostrils daily. 10/08/22  Yes Mirayah Wren-Warren, Sadie Haber, NP  allopurinol (ZYLOPRIM) 300 MG tablet Take 1 tablet (300 mg total) by mouth daily. 11/07/20   Darreld Mclean, MD  amLODipine (NORVASC) 10 MG tablet Take 10 mg by mouth daily. 03/04/22   [provider]  hydrochlorothiazide (HYDRODIURIL) 50 MG tablet Take 1 tablet (50 mg total) by mouth daily. 05/08/21   Bing Neighbors, FNP  lovastatin (MEVACOR) 20 MG tablet Take 20 mg by mouth at bedtime.    [provider]  metFORMIN (GLUCOPHAGE) 1000 MG tablet Take 1,000 mg by mouth 2 (two) times daily. 05/03/21   [provider]  predniSONE (DELTASONE) 20 MG tablet Take 2 tablets (40 mg total) by mouth daily with  breakfast. Patient not taking: Reported on 03/18/2022 05/08/21   Bing Neighbors, FNP  tadalafil (CIALIS) 10 MG tablet Take 10 mg by mouth daily. 03/11/22   [provider]    Family History Family History  Problem Relation Age of Onset   Diabetes Other     Social History Social History   Tobacco Use   Smoking status: Former    Packs/day: 0.50    Years: 5.00    Total pack years: 2.50    Types: Cigarettes    Quit date: 07/2019    Years since quitting: 3.2   Smokeless tobacco: Never  Substance Use Topics   Alcohol use: Yes    Comment: once aweek   Drug use: No     Allergies   Ace inhibitors, Lisinopril, and Penicillins   Review of Systems Review of Systems Per HPI  Physical Exam Triage Vital Signs ED Triage Vitals  Enc Vitals Group     BP 10/08/22 1224 (!) 159/104     Pulse Rate 10/08/22 1222 (!) 114     Resp 10/08/22 1222 20     Temp 10/08/22 1222 98.9 F (37.2 C)     Temp Source 10/08/22 1222 Oral     SpO2 10/08/22 1222 96 %     Weight --      Height --      Head Circumference --  Peak Flow --      Pain Score 10/08/22 1223 0     Pain Loc --      Pain Edu? --      Excl. in [place]? --    No data found.  Updated Vital Signs BP (!) 154/102 (BP Location: Right Arm)   Pulse (!) 110   Temp 98.9 F (37.2 C) (Oral)   Resp 20   SpO2 96%   Visual Acuity Right Eye Distance:   Left Eye Distance:   Bilateral Distance:    Right Eye Near:   Left Eye Near:    Bilateral Near:     Physical Exam Vitals and nursing note reviewed.  Constitutional:      General: He is not in acute distress.    Appearance: Normal appearance.  HENT:     Head: Normocephalic.     Right Ear: Tympanic membrane, ear canal and external ear normal.     Left Ear: Tympanic membrane, ear canal and external ear normal.     Nose: Nose normal. No rhinorrhea.     Mouth/Throat:     Mouth: Mucous membranes are moist.     Pharynx: No posterior oropharyngeal erythema.  Eyes:      Extraocular Movements: Extraocular movements intact.     Conjunctiva/sclera: Conjunctivae normal.     Pupils: Pupils are equal, round, and reactive to light.  Cardiovascular:     Rate and Rhythm: Normal rate and regular rhythm.     Pulses: Normal pulses.     Heart sounds: Normal heart sounds.  Pulmonary:     Effort: Pulmonary effort is normal. No respiratory distress.     Breath sounds: Normal breath sounds. No stridor. No wheezing, rhonchi or rales.  Abdominal:     General: Bowel sounds are normal.     Palpations: Abdomen is soft.     Tenderness: There is no abdominal tenderness.  Musculoskeletal:     Cervical back: Normal range of motion.  Lymphadenopathy:     Cervical: No cervical adenopathy.  Skin:    General: Skin is warm and dry.  Neurological:     General: No focal deficit present.     Mental Status: He is alert and oriented to person, place, and time.  Psychiatric:        Mood and Affect: Mood normal.        Behavior: Behavior normal.      UC Treatments / Results  Labs (all labs ordered are listed, but only abnormal results are displayed) Labs Reviewed  SARS CORONAVIRUS 2 (TAT 6-24 HRS)    EKG   Radiology No results found.  Procedures Procedures (including critical care time)  Medications Ordered in UC Medications - No data to display  Initial Impression / Assessment and Plan / UC Course  I have reviewed the triage vital signs and the nursing notes.  Pertinent labs & imaging results that were available during my care of the patient were reviewed by me and considered in my medical decision making (see chart for details).  The patient is well-appearing, is in no acute distress. He is hypertensive and tachycardia.  [place] elevated blood pressure with with the patient and advised to follow-up with his PCP if blood pressure remains elevated.  Patient advised to check his blood pressure daily.  Suspected viral upper respiratory infection with cough at this  time.  COVID test is pending.  Due to patient's elevated blood pressure, will start him on benzonatate 100 mg for the  cough, and for his nasal congestion, fluticasone 50 mcg nasal spray was prescribed.  Patient is a candidate to receive Paxlovid if his COVID test is positive.  (Lab work from 05/16/2022 was reviewed).  Supportive care recommendations were provided to the patient to include increasing fluids, allowing for plenty of rest.  Discussed viral etiology with the patient and when follow-up may be indicated.  Patient verbalizes understanding.  All questions were answered.  Patient stable for discharge. Final Clinical Impressions(s) / UC Diagnoses   Final diagnoses:  Encounter for screening for COVID-19  Viral upper respiratory tract infection with cough     Discharge Instructions      You have a viral upper respiratory infection.  Symptoms should improve over the next week to 10 days.  If you develop chest pain or shortness of breath, go to the emergency room.   We have tested you today for COVID-19.  You will see the results in Mychart and we will call you with positive results. Please stay home and isolate until you are aware of the results.  If test is positive, you are a candidate to receive paxlovid as antiviral therapy.  Take medication as prescribed.  You can also begin taking over-the-counter Coricidin HBP or Mucinex for people with high blood pressure and diabetes while your symptoms persist.   Some things that can make you feel better are: - Increased rest - Increasing fluid with water/sugar free electrolytes - Acetaminophen and ibuprofen as needed for fever/pain - Salt water gargling, chloraseptic spray and throat lozenges - Saline sinus flushes or a neti pot - Humidifying the air  As discussed, if symptoms do not improve over the next 7 to 10 days, or if they suddenly worsen, please follow-up in this clinic or with your primary care physician for further evaluation.       ED Prescriptions     Medication Sig Dispense Auth. Provider   fluticasone (FLONASE) 50 MCG/ACT nasal spray Place 2 sprays into both nostrils daily. 16 g Geno Sydnor-Warren, Alda Lea, NP   benzonatate (TESSALON PERLES) 100 MG capsule Take 1 capsule (100 mg total) by mouth 3 (three) times daily as needed for cough. 30 capsule Jamile Rekowski-Warren, Alda Lea, NP      PDMP not reviewed this encounter.   Tish Men, NP 10/08/22 1308

## 2022-10-08 NOTE — ED Triage Notes (Signed)
Pt reports cough, nasal congestion, x 2 days.

## 2022-10-09 ENCOUNTER — Telehealth (HOSPITAL_COMMUNITY): Payer: Self-pay | Admitting: Emergency Medicine

## 2022-10-09 LAB — SARS CORONAVIRUS 2 (TAT 6-24 HRS): SARS Coronavirus 2: POSITIVE — AB

## 2022-10-09 MED ORDER — NIRMATRELVIR/RITONAVIR (PAXLOVID)TABLET: 3.0000 | ORAL_TABLET | Freq: Two times a day (BID) | ORAL | 0 refills | Status: AC

## 2022-11-21 ENCOUNTER — Encounter: Payer: Self-pay | Admitting: Radiology

## 2023-04-10 ENCOUNTER — Encounter: Payer: Self-pay | Admitting: *Deleted

## 2023-09-02 ENCOUNTER — Other Ambulatory Visit: Payer: Self-pay

## 2023-09-02 ENCOUNTER — Emergency Department (HOSPITAL_COMMUNITY): Payer: 59

## 2023-09-02 ENCOUNTER — Emergency Department (HOSPITAL_COMMUNITY)
Admission: EM | Admit: 2023-09-02 | Discharge: 2023-09-02 | Disposition: A | Discharge status: Home / Self Care | Payer: 59 | Attending: Emergency Medicine | Admitting: Emergency Medicine

## 2023-09-02 ENCOUNTER — Encounter (HOSPITAL_COMMUNITY): Payer: Self-pay | Admitting: *Deleted

## 2023-09-02 DIAGNOSIS — M79671 Pain in right foot: Secondary | ICD-10-CM

## 2023-09-02 DIAGNOSIS — I10 Essential (primary) hypertension: Secondary | ICD-10-CM | POA: Diagnosis not present

## 2023-09-02 DIAGNOSIS — M25571 Pain in right ankle and joints of right foot: Secondary | ICD-10-CM | POA: Diagnosis present

## 2023-09-02 DIAGNOSIS — Z79899 Other long term (current) drug therapy: Secondary | ICD-10-CM | POA: Diagnosis not present

## 2023-09-02 MED ORDER — ACETAMINOPHEN 500 MG PO TABS
1000.0000 mg | ORAL_TABLET | Freq: Once | ORAL | Status: AC
  Administered 2023-09-02: 1000 mg via ORAL
  Filled 2023-09-02: qty 2

## 2023-09-02 MED ORDER — HYDROCODONE-ACETAMINOPHEN 5-325 MG PO TABS
1.0000 | ORAL_TABLET | Freq: Once | ORAL | Status: DC
  Filled 2023-09-02: qty 1

## 2023-09-02 MED ORDER — IBUPROFEN 800 MG PO TABS
800.0000 mg | ORAL_TABLET | Freq: Once | ORAL | Status: AC
  Administered 2023-09-02: 800 mg via ORAL
  Filled 2023-09-02: qty 1

## 2023-09-02 NOTE — ED Notes (Signed)
RIGHT ankle pain Denies any recent trauma Notable swelling to RIGHT foot Attached to partial monitor Pt already had XRAY waiting on results and provider exam

## 2023-09-02 NOTE — ED Triage Notes (Signed)
Pt states yesterday he noticed he started having pain to right ankle and this am when he woke up he had increased pain and swelling and unable to bear weight  Pt has injured same ankle in the past but denies any obvious injury at this time

## 2023-09-02 NOTE — Discharge Instructions (Signed)
Evaluation today was overall reassuring.  X-ray revealed that you may have a small spur on your back heel near the Achilles this could be contributing to your symptoms.  Recommend you follow-up with your PCP.  You can treat conservatively at home with rest and ice.  Also recommend ibuprofen and Tylenol as needed for pain.  If your ankle becomes red, more swollen, hot to touch or if you develop a fever or any other concerning symptom please return to the emergency department further evaluation.
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
## 2024-07-13 ENCOUNTER — Encounter (HOSPITAL_COMMUNITY): Payer: Self-pay

## 2024-07-13 ENCOUNTER — Other Ambulatory Visit: Payer: Self-pay

## 2024-07-13 ENCOUNTER — Emergency Department (HOSPITAL_COMMUNITY): Payer: Self-pay

## 2024-07-13 ENCOUNTER — Emergency Department (HOSPITAL_COMMUNITY)
Admission: EM | Admit: 2024-07-13 | Discharge: 2024-07-13 | Disposition: A | Discharge status: Home / Self Care | Payer: Self-pay | Attending: Emergency Medicine | Admitting: Emergency Medicine

## 2024-07-13 DIAGNOSIS — Y92410 Unspecified street and highway as the place of occurrence of the external cause: Secondary | ICD-10-CM | POA: Insufficient documentation

## 2024-07-13 DIAGNOSIS — M25512 Pain in left shoulder: Secondary | ICD-10-CM | POA: Insufficient documentation

## 2024-07-13 MED ORDER — IBUPROFEN 800 MG PO TABS
800.0000 mg | ORAL_TABLET | Freq: Once | ORAL | Status: AC
  Administered 2024-07-13: 800 mg via ORAL
  Filled 2024-07-13: qty 1

## 2024-07-13 MED ORDER — IBUPROFEN 600 MG PO TABS: 600.0000 mg | ORAL_TABLET | Freq: Four times a day (QID) | ORAL | 0 refills | Status: AC | PRN
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
## 2024-07-13 NOTE — Discharge Instructions (Signed)
 Your x-ray does not show any obvious bony injuries meaning no fractures or dislocations.  However you can injure the cartilage or other soft tissues in your shoulder joint which we cannot see on a plain x-ray.  I do recommend using the pain medication prescribed along with using ice packs is much as comfortable for the next several days to help with pain and swelling.  If your symptoms are not completely improved over the next 7 to 10 days you would benefit from evaluation by an orthopedist, see the referral above for this.

## 2024-08-07 ENCOUNTER — Other Ambulatory Visit: Payer: Self-pay

## 2024-08-07 ENCOUNTER — Emergency Department (HOSPITAL_COMMUNITY)

## 2024-08-07 ENCOUNTER — Encounter (HOSPITAL_COMMUNITY): Payer: Self-pay

## 2024-08-07 ENCOUNTER — Emergency Department (HOSPITAL_COMMUNITY)
Admission: EM | Admit: 2024-08-07 | Discharge: 2024-08-07 | Disposition: A | Discharge status: Home / Self Care | Attending: Emergency Medicine | Admitting: Emergency Medicine

## 2024-08-07 DIAGNOSIS — M7989 Other specified soft tissue disorders: Secondary | ICD-10-CM | POA: Diagnosis present

## 2024-08-07 DIAGNOSIS — M25461 Effusion, right knee: Secondary | ICD-10-CM | POA: Insufficient documentation

## 2024-08-07 LAB — SYNOVIAL CELL COUNT + DIFF, W/ CRYSTALS
Crystals, Fluid: NONE SEEN
Eosinophils-Synovial: 0 % (ref 0–1)
Lymphocytes-Synovial Fld: 0 % (ref 0–20)
Monocyte-Macrophage-Synovial Fluid: 6 % — ABNORMAL LOW (ref 50–90)
Neutrophil, Synovial: 94 % — ABNORMAL HIGH (ref 0–25)
WBC, Synovial: 3580 /mm3 — ABNORMAL HIGH (ref 0–200)

## 2024-08-07 LAB — CBC WITH DIFFERENTIAL/PLATELET
Abs Immature Granulocytes: 0.02 K/uL (ref 0.00–0.07)
Basophils Absolute: 0.1 K/uL (ref 0.0–0.1)
Basophils Relative: 1 %
Eosinophils Absolute: 0 K/uL (ref 0.0–0.5)
Eosinophils Relative: 0 %
HCT: 48.7 % (ref 39.0–52.0)
Hemoglobin: 16.6 g/dL (ref 13.0–17.0)
Immature Granulocytes: 0 %
Lymphocytes Relative: 14 %
Lymphs Abs: 1.2 K/uL (ref 0.7–4.0)
MCH: 30.7 pg (ref 26.0–34.0)
MCHC: 34.1 g/dL (ref 30.0–36.0)
MCV: 90.2 fL (ref 80.0–100.0)
Monocytes Absolute: 0.6 K/uL (ref 0.1–1.0)
Monocytes Relative: 7 %
Neutro Abs: 6.7 K/uL (ref 1.7–7.7)
Neutrophils Relative %: 78 %
Platelets: 260 K/uL (ref 150–400)
RBC: 5.4 MIL/uL (ref 4.22–5.81)
RDW: 13 % (ref 11.5–15.5)
WBC: 8.7 K/uL (ref 4.0–10.5)
nRBC: 0 % (ref 0.0–0.2)

## 2024-08-07 LAB — C-REACTIVE PROTEIN: CRP: 4.1 mg/dL — ABNORMAL HIGH (ref <1.0)

## 2024-08-07 LAB — SEDIMENTATION RATE: Sed Rate: 8 mm/h (ref 0–15)

## 2024-08-07 MED ORDER — LIDOCAINE HCL (PF) 1 % IJ SOLN
5.0000 mL | Freq: Once | INTRAMUSCULAR | Status: AC
  Administered 2024-08-07: 5 mL
  Filled 2024-08-07: qty 5

## 2024-08-07 MED ORDER — FUROSEMIDE 40 MG PO TABS: 20.0000 mg | ORAL_TABLET | Freq: Once | ORAL | Status: DC

## 2024-08-07 MED ORDER — OXYCODONE-ACETAMINOPHEN 5-325 MG PREPACK: ORAL_TABLET | ORAL | 0 refills | Status: AC

## 2024-08-07 MED ORDER — KETOROLAC TROMETHAMINE 15 MG/ML IJ SOLN
15.0000 mg | Freq: Once | INTRAMUSCULAR | Status: AC
  Administered 2024-08-07: 15 mg via INTRAMUSCULAR
  Filled 2024-08-07: qty 1

## 2024-08-07 MED ORDER — LOSARTAN POTASSIUM 25 MG PO TABS: 100.0000 mg | ORAL_TABLET | Freq: Once | ORAL | Status: DC

## 2024-08-07 MED ORDER — OXYCODONE HCL 5 MG PO TABS
5.0000 mg | ORAL_TABLET | Freq: Once | ORAL | Status: AC
  Administered 2024-08-07: 5 mg via ORAL
  Filled 2024-08-07: qty 1

## 2024-08-07 MED ORDER — MELOXICAM 7.5 MG PO TABS: 7.5000 mg | ORAL_TABLET | Freq: Every day | ORAL | 0 refills | Status: AC

## 2024-08-07 NOTE — ED Notes (Signed)
 Lidocaine at bedside.

## 2024-08-07 NOTE — ED Provider Notes (Signed)
 Patient's care assumed at 7 PM.  Patient is pending Gram stain. There have been multiple delays in the return of patient's Gram stain results. Gram stain was sent to [facility].  RN is working with lab in order to get results. Patient's care is turned over to Dr. Theadore. with Gram stain pending   Titianna Loomis K, PA-C 08/07/24 2302    Towana Ozell BROCKS, MD 08/08/24 [phone]

## 2024-08-07 NOTE — Discharge Instructions (Addendum)
 You are seen today for fluid in the knee, we were able to draw some fluid off, it does not appear to be infected, we are starting you on anti-inflammatories, you can also take over-the-counter Tylenol  and follow-up with orthopedic doctor.  Come back to the ER if you have new or worsening symptoms such as redness, fever, worsening pain or other worrisome symptoms.

## 2024-08-07 NOTE — ED Provider Notes (Signed)
 [place] EMERGENCY DEPARTMENT AT [facility] Provider Note   CSN: [id number] Arrival date & time: 08/07/24  1014     Patient presents with: Joint Swelling   Randy Beltran is a 48 y.o. male.  He presents the ER today complaining of 1 day of right knee swelling.  He states started acutely yesterday morning, he is having severe pain with walking, denies fever or chills but does note that is warm to touch.  He has tried ice and over-the-counter medicine without relief.  He has never had this in the past.  He states he had a fusion of his left knee several years ago but this is in the setting of an injury.  He denies history of gout although does have several ED visits many years ago with diagnosis of gout, located in the left foot at that point.   HPI     Prior to Admission medications   Medication Sig Start Date End Date Taking? Authorizing Provider  allopurinol  (ZYLOPRIM ) 300 MG tablet Take 1 tablet (300 mg total) by mouth daily. 11/07/20   Brenna Lin, MD  amLODipine (NORVASC) 10 MG tablet Take 10 mg by mouth daily. 03/04/22   [provider]  benzonatate  (TESSALON  PERLES) 100 MG capsule Take 1 capsule (100 mg total) by mouth 3 (three) times daily as needed for cough. 10/08/22   Leath-Warren, Etta PARAS, NP  fluticasone  (FLONASE ) 50 MCG/ACT nasal spray Place 2 sprays into both nostrils daily. 10/08/22   Leath-Warren, Etta PARAS, NP  hydrochlorothiazide  (HYDRODIURIL ) 50 MG tablet Take 1 tablet (50 mg total) by mouth daily. 05/08/21   Arloa Suzen RAMAN, NP  ibuprofen  (ADVIL ) 600 MG tablet Take 1 tablet (600 mg total) by mouth every 6 (six) hours as needed. 07/13/24   Idol, Julie, PA-C  lovastatin (MEVACOR) 20 MG tablet Take 20 mg by mouth at bedtime.    [provider]  metFORMIN (GLUCOPHAGE) 1000 MG tablet Take 1,000 mg by mouth 2 (two) times daily. 05/03/21   [provider]  predniSONE  (DELTASONE ) 20 MG tablet Take 2 tablets (40 mg total) by mouth  daily with breakfast. Patient not taking: Reported on 03/18/2022 05/08/21   Arloa Suzen RAMAN, NP  tadalafil (CIALIS) 10 MG tablet Take 10 mg by mouth daily. 03/11/22   [provider]    Allergies: Ace inhibitors, Lisinopril , and Penicillins    Review of Systems  Updated Vital Signs BP (!) 162/87   Pulse 90   Temp 98.1 F (36.7 C) (Oral)   Resp 18   Ht 6' 2 (1.88 m)   Wt 131.5 kg   SpO2 95%   BMI 37.22 kg/m   Physical Exam Vitals and nursing note reviewed.  Constitutional:      General: He is not in acute distress.    Appearance: He is well-developed.  HENT:     Head: Normocephalic and atraumatic.     Mouth/Throat:     Mouth: Mucous membranes are moist.  Eyes:     Extraocular Movements: Extraocular movements intact.     Conjunctiva/sclera: Conjunctivae normal.     Pupils: Pupils are equal, round, and reactive to light.  Cardiovascular:     Rate and Rhythm: Normal rate and regular rhythm.     Heart sounds: No murmur heard. Pulmonary:     Effort: Pulmonary effort is normal. No respiratory distress.     Breath sounds: Normal breath sounds.  Abdominal:     Palpations: Abdomen is soft.  Tenderness: There is no abdominal tenderness.  Musculoskeletal:        General: No swelling.     Cervical back: Neck supple.  Skin:    General: Skin is warm and dry.     Capillary Refill: Capillary refill takes less than 2 seconds.  Neurological:     General: No focal deficit present.     Mental Status: He is alert and oriented to person, place, and time.  Psychiatric:        Mood and Affect: Mood normal.     (all labs ordered are listed, but only abnormal results are displayed) Labs Reviewed - No data to display  EKG: None  Radiology: DG Knee Right Port Result Date: 08/07/2024 CLINICAL DATA:  Right knee pain with swelling. EXAM: PORTABLE RIGHT KNEE - 1-2 VIEW COMPARISON:  No comparison studies available. FINDINGS: No evidence for an acute fracture. No  dislocation. Meniscal calcification evident. Lateral film documents a large effusion. IMPRESSION: 1. Large joint effusion. 2. No acute bony findings. Electronically Signed   By: Camellia Candle M.D.   On: 08/07/2024 11:13     .Joint Aspiration/Arthrocentesis  Date/Time: 08/07/2024 3:05 PM  Performed by: Suellen Sherran LABOR, PA-C Authorized by: Suellen Sherran LABOR, PA-C   Consent:    Consent obtained:  Verbal and written   Consent given by:  Patient   Risks discussed:  Bleeding, infection, pain, nerve damage and incomplete drainage   Alternatives discussed:  No treatment Universal protocol:    Procedure explained and questions answered to patient or proxy's satisfaction: yes     Imaging studies available: yes     Patient identity confirmed:  Verbally with patient Location:    Location:  Knee   Knee:  R knee Anesthesia:    Anesthesia method:  Local infiltration   Local anesthetic:  Lidocaine 1% w/o epi Procedure details:    Preparation: Patient was prepped and draped in usual sterile fashion     Needle gauge:  18 G   Ultrasound guidance: yes     Approach:  Lateral   Aspirate amount:  50 cc   Aspirate characteristics:  Yellow   Steroid injected: no     Specimen collected: yes   Post-procedure details:    Dressing:  Adhesive bandage   Procedure completion:  Tolerated well, no immediate complications    Medications Ordered in the ED  oxyCODONE  (Oxy IR/ROXICODONE ) immediate release tablet 5 mg (has no administration in time range)                                    Medical Decision Making Differential diagnose includes but not limited to gout, inflammatory arthritis, septic arthritis, osteoarthritis, other  ED course: Patient presents with acutely painful, swollen and warm right knee with severely limited range of motion due to pain.  Due to concern for possible septic arthritis labs and x-ray ordered which were reassuring but showed large effusion.  Patient was agreeable with  proceeding with arthrocentesis after discussion of possible risks.  I was able to aspirate some turbid fluid, synovial analysis showed yellow turbid fluid with no crystals, 3580 white blood cells per cubic millimeter with 94% neutrophils.  I consulted orthopedics and talked with Dr. Georgina.  Felt this was not consistent with septic arthritis assuming his Gram stain comes back is negative.  Gram stain is sent but is still pending, signed out to [employer], PA-C.  Will anticipate discharge home with NSAIDs and orthopedic follow-up.  Amount and/or Complexity of Data Reviewed Labs: ordered. Radiology: ordered.  Risk Prescription drug management.        Final diagnoses:  None    ED Discharge Orders     None          Suellen Sherran DELENA DEVONNA 08/07/24 1911    Suzette Pac, MD 08/10/24 [phone]

## 2024-08-07 NOTE — ED Triage Notes (Signed)
 Pt arrived POV with c/o right knee pain, warmth and swelling since yesterday morning.

## 2024-08-07 NOTE — ED Notes (Signed)
 Pt completely unable to bend affected knee. Provider at bedside.

## 2024-08-07 NOTE — ED Notes (Signed)
 ED Provider at bedside.

## 2024-08-07 NOTE — ED Notes (Signed)
 Discharge instructions reviewed.   Newly prescribed medications discussed. Pharmacy verified.   Opportunity for questions and concerns provided.   Alert, oriented and ambulatory.   Displays no signs of distress.   Take home pack of Oxycodone  provided.

## 2024-08-09 LAB — GLUCOSE, BODY FLUID OTHER: Glucose, Body Fluid Other: 187 mg/dL

## 2024-08-09 LAB — PROTEIN, BODY FLUID (OTHER): Total Protein, Body Fluid Other: 4.3 g/dL

## 2024-08-09 MED FILL — Oxycodone w/ Acetaminophen Tab 5-325 MG: ORAL | Qty: 6 | Status: AC

## 2024-08-11 LAB — BODY FLUID CULTURE W GRAM STAIN
Culture: NO GROWTH
Special Requests: NORMAL

## 2024-10-13 ENCOUNTER — Encounter (HOSPITAL_BASED_OUTPATIENT_CLINIC_OR_DEPARTMENT_OTHER): Payer: Self-pay

## 2024-10-13 ENCOUNTER — Ambulatory Visit (HOSPITAL_BASED_OUTPATIENT_CLINIC_OR_DEPARTMENT_OTHER)

## 2024-10-13 VITALS — BP 154/98 | HR 94 | Ht 74.0 in | Wt 293.0 lb

## 2024-10-13 DIAGNOSIS — F17210 Nicotine dependence, cigarettes, uncomplicated: Secondary | ICD-10-CM | POA: Diagnosis not present

## 2024-10-13 DIAGNOSIS — G4733 Obstructive sleep apnea (adult) (pediatric): Secondary | ICD-10-CM

## 2024-10-13 DIAGNOSIS — Z720 Tobacco use: Secondary | ICD-10-CM

## 2024-10-13 DIAGNOSIS — I10 Essential (primary) hypertension: Secondary | ICD-10-CM

## 2024-10-13 NOTE — Patient Instructions (Signed)
 Complete titration sleep study as ordered.  Follow up in 2 months to review results and discuss treatment plan.

## 2024-10-13 NOTE — Progress Notes (Signed)
 "  @Patient  ID: Randy Beltran, male    DOB: 06-26-1976, 49 y.o.   MRN: [id number]  Chief Complaint  Patient presents with   Establish Care    New sleep     Referring provider: Debby Croak, Jenkins Jansky, NP-C  HPI: Discussed the use of AI scribe software for clinical note transcription with the patient, who gave verbal consent to proceed.  History of Present Illness Randy Beltran is a 49 year old male with severe sleep apnea who presents with issues related to CPAP use.  He has been using a CPAP machine for approximately three years after being diagnosed with severe sleep apnea, characterized by 80 events per hour. The CPAP machine, set to auto-adjust up to 20 , is causing significant dryness in his mouth, leading to avoidance of its use. He suspects the machine or mask may not be functioning properly, as it previously kept his mouth closed, but now it does not. He uses a nasal mask and has attempted to adjust the humidity settings without success.  He wakes up three or more times a night, not related to bathroom needs, but possibly due to his habit of burning wood and checking the stove. He typically goes to bed around 10 PM, takes 15-30 minutes to fall asleep, and wakes up around 5:30 AM. His weight has fluctuated by about 20 pounds over the last couple of years.  He experiences significant daytime sleepiness, feeling tired and bored, and can easily fall asleep during the day, including while waiting for appointments. No headaches, and he has never fallen asleep at the wheel, although he has felt sleepy while driving and has pulled over to nap when necessary.  He has a history of high blood pressure, diabetes, and high cholesterol. He did not take his blood pressure medication on the morning of the visit and smoked three cigarettes before arriving. He smokes a pack of cigarettes a day and has been smoking for over 25 years, with a brief period of cessation. He denies alcohol and drug  use.  No chest pain, palpitations, breathlessness on activity, headaches, and sinus or throat surgeries.   TEST/EVENTS : Sleep study completed in [place], [place].  Not available for review.  Patient reports 80 events/hr:  severe OSA  Epworth of 24 today.  Allergies[1]  Immunization History  Administered Date(s) Administered   Moderna Covid-19 Fall Seasonal Vaccine 32yrs & older 11/17/2023    Past Medical History:  Diagnosis Date   Gout    Hypertension     Tobacco History: Tobacco Use History[2] Ready to quit: Not Answered Counseling given: Not Answered   Outpatient Medications Prior to Visit  Medication Sig Dispense Refill   allopurinol  (ZYLOPRIM ) 300 MG tablet Take 1 tablet (300 mg total) by mouth daily. 30 tablet 5   amLODipine (NORVASC) 10 MG tablet Take 10 mg by mouth daily.     benzonatate  (TESSALON  PERLES) 100 MG capsule Take 1 capsule (100 mg total) by mouth 3 (three) times daily as needed for cough. 30 capsule 0   chlorthalidone (HYGROTON) 25 MG tablet Take 25 mg by mouth daily.     fluticasone  (FLONASE ) 50 MCG/ACT nasal spray Place 2 sprays into both nostrils daily. 16 g 0   hydrochlorothiazide  (HYDRODIURIL ) 50 MG tablet Take 1 tablet (50 mg total) by mouth daily. 30 tablet 0   ibuprofen  (ADVIL ) 600 MG tablet Take 1 tablet (600 mg total) by mouth every 6 (six) hours as needed. 30 tablet 0  losartan  (COZAAR ) 50 MG tablet Take 50 mg by mouth 2 (two) times daily.     lovastatin (MEVACOR) 20 MG tablet Take 20 mg by mouth at bedtime.     meloxicam  (MOBIC ) 7.5 MG tablet Take 1 tablet (7.5 mg total) by mouth daily. 5 tablet 0   metFORMIN (GLUCOPHAGE) 1000 MG tablet Take 1,000 mg by mouth 2 (two) times daily.     oxyCODONE -acetaminophen  (PERCOCET) 5-325 mg TABS tablet One tablet every 4 hours prn pain 6 tablet 0   predniSONE  (DELTASONE ) 20 MG tablet Take 2 tablets (40 mg total) by mouth daily with breakfast. 10 tablet 0   tadalafil (CIALIS) 10 MG tablet Take 10 mg by mouth  daily.     No facility-administered medications prior to visit.     Review of Systems: as per hpi  Constitutional:   No  weight loss, night sweats,  Fevers, chills, fatigue, or  lassitude.  HEENT:   No headaches,  Difficulty swallowing,  Tooth/dental problems, or  Sore throat,                No sneezing, itching, ear ache, nasal congestion, post nasal drip,   CV:  No chest pain,  Orthopnea, PND, swelling in lower extremities, anasarca, dizziness, palpitations, syncope.   GI  No heartburn, indigestion, abdominal pain, nausea, vomiting, diarrhea, change in bowel habits, loss of appetite, bloody stools.   Resp: No shortness of breath with exertion or at rest.  No excess mucus, no productive cough,  No non-productive cough,  No coughing up of blood.  No change in color of mucus.  No wheezing.  No chest wall deformity  Skin: no rash or lesions.  GU: no dysuria, change in color of urine, no urgency or frequency.  No flank pain, no hematuria   MS:  No joint pain or swelling.  No decreased range of motion.  No back pain.    Physical Exam  BP (!) 154/98   Pulse 94   Ht 6' 2 (1.88 m)   Wt 293 lb (132.9 kg)   SpO2 97%   BMI 37.62 kg/m   GEN: A/Ox3; pleasant , NAD, well nourished    HEENT:  [place]/AT,  EACs-clear, TMs-wnl, NOSE-clear, THROAT-clear, no lesions, no postnasal drip or exudate noted. Mallampati 3  NECK:  Supple w/ fair ROM; no JVD; normal carotid impulses w/o bruits; no thyromegaly or nodules palpated; no lymphadenopathy.    RESP  Clear  P & A; w/o, wheezes/ rales/ or rhonchi. no accessory muscle use, no dullness to percussion  CARD:  RRR, no m/r/g, no peripheral edema, pulses intact, no cyanosis or clubbing.  GI:   obese, soft & nt; nml bowel sounds; no organomegaly or masses detected.   Musco: Warm bil, no deformities or joint swelling noted.   Neuro: alert, no focal deficits noted.    Skin: Warm, no lesions or rashes    Lab Results:  CBC    Component Value  Date/Time   WBC 8.7 08/07/2024 1243   RBC 5.40 08/07/2024 1243   HGB 16.6 08/07/2024 1243   HCT 48.7 08/07/2024 1243   PLT 260 08/07/2024 1243   MCV 90.2 08/07/2024 1243   MCH 30.7 08/07/2024 1243   MCHC 34.1 08/07/2024 1243   RDW 13.0 08/07/2024 1243   LYMPHSABS 1.2 08/07/2024 1243   MONOABS 0.6 08/07/2024 1243   EOSABS 0.0 08/07/2024 1243   BASOSABS 0.1 08/07/2024 1243    BMET    Component Value Date/Time  NA 134 (L) 05/16/2022 0932   K 3.3 (L) 05/16/2022 0932   CL 100 05/16/2022 0932   CO2 25 05/16/2022 0932   GLUCOSE 150 (H) 05/16/2022 0932   BUN 9 05/16/2022 0932   CREATININE 0.88 05/16/2022 0932   CALCIUM 9.1 05/16/2022 0932   GFRNONAA >60 05/16/2022 0932   GFRAA >90 10/18/2012 1349    BNP No results found for: BNP  ProBNP No results found for: PROBNP  Imaging: No results found.  Administration History     None           No data to display          No results found for: NITRICOXIDE   Assessment & Plan:   Assessment & Plan OSA (obstructive sleep apnea)  Tobacco abuse  Assessment and Plan Assessment & Plan Obstructive sleep apnea Severe with 80 events/hour. Current CPAP ineffective due to humidification and mask fit issues. Discussed high cardiovascular risks from smoking and untreated apnea. - Ordered titration study to optimize CPAP settings. - Attempted CPAP data retrieval for compliance and settings assessment; Data download suggests that he has used it 15 of the last 90 days with an average of ~3hrs a night.  AHI at 7.7/hr with large leak profile. - Ordered new CPAP supplies if necessary.  Hypertension Likely worsened by untreated sleep apnea and smoking. Elevated blood pressure possibly due to missed medication and smoking. - Discussed smoking cessation to reduce cardiovascular risk; patient not interested in smoking cessation presently   Return in about 2 months (around 12/11/2024) for sleep study review.  Candis Dandy,  PA-C 10/13/2024      [1]  Allergies Allergen Reactions   Ace Inhibitors Swelling    Angioedema    Penicillins Swelling    Severe swelling of eyes, lips, and tongue   Lisinopril  Other (See Comments)    Unknown   [2]  Social History Tobacco Use  Smoking Status Every Day   Current packs/day: 0.00   Average packs/day: 0.5 packs/day for 5.0 years (2.5 ttl pk-yrs)   Types: Cigarettes   Start date: 07/2014   Last attempt to quit: 07/2019   Years since quitting: 5.2   Passive exposure: Past  Smokeless Tobacco Never   "

## 2024-10-13 NOTE — Progress Notes (Signed)
 Epworth Sleepiness Scale  Use the following scale to choose the most appropriate number for each situation. 0 Would never nod off 1  Slight  chance of nodding off 2 Moderate chance of nodding off 3 High chance of nodding off  Sitting and reading: 3 Watching TV: 3 Sitting, inactive, in a public place (e.g., in a meeting, theater, or dinner event): 3 As a passenger in a car for an hour or more without stopping for a break: 3 Lying down to rest when circumstances permit:3 Sitting and talking to someone: 3 Sitting quietly after a meal without alcohol: 3 In a car, while stopped for a few  minutes in traffic or at a light: 3   TOTOAL: 24

## 2024-12-28 ENCOUNTER — Ambulatory Visit (HOSPITAL_BASED_OUTPATIENT_CLINIC_OR_DEPARTMENT_OTHER): Admitting: Pulmonary Disease
